# Patient Record
Sex: Male | Born: 1935 | Race: Black or African American | Hispanic: No | Smoking: Current every day smoker
Health system: Southern US, Community
[De-identification: ages and names within clinical notes are randomized; demographics above are authoritative.]

## PROBLEM LIST (undated history)

## (undated) DIAGNOSIS — G309 Alzheimer's disease, unspecified: Secondary | ICD-10-CM

## (undated) DIAGNOSIS — F0280 Dementia in other diseases classified elsewhere without behavioral disturbance: Secondary | ICD-10-CM

## (undated) DIAGNOSIS — I639 Cerebral infarction, unspecified: Secondary | ICD-10-CM

## (undated) DIAGNOSIS — R627 Adult failure to thrive: Secondary | ICD-10-CM

## (undated) DIAGNOSIS — C61 Malignant neoplasm of prostate: Secondary | ICD-10-CM

## (undated) DIAGNOSIS — R400 Somnolence: Secondary | ICD-10-CM

## (undated) DIAGNOSIS — C801 Malignant (primary) neoplasm, unspecified: Secondary | ICD-10-CM

---

## 2012-08-01 ENCOUNTER — Emergency Department (HOSPITAL_COMMUNITY): Payer: Medicare PPO

## 2012-08-01 ENCOUNTER — Emergency Department (HOSPITAL_COMMUNITY)
Admission: EM | Admit: 2012-08-01 | Discharge: 2012-08-01 | Disposition: A | Discharge status: Home / Self Care | Payer: Medicare PPO | Attending: Emergency Medicine | Admitting: Emergency Medicine

## 2012-08-01 ENCOUNTER — Encounter (HOSPITAL_COMMUNITY): Payer: Self-pay | Admitting: *Deleted

## 2012-08-01 DIAGNOSIS — I498 Other specified cardiac arrhythmias: Secondary | ICD-10-CM | POA: Insufficient documentation

## 2012-08-01 DIAGNOSIS — R001 Bradycardia, unspecified: Secondary | ICD-10-CM

## 2012-08-01 DIAGNOSIS — R55 Syncope and collapse: Secondary | ICD-10-CM | POA: Insufficient documentation

## 2012-08-01 DIAGNOSIS — G459 Transient cerebral ischemic attack, unspecified: Secondary | ICD-10-CM | POA: Insufficient documentation

## 2012-08-01 LAB — CBC WITH DIFFERENTIAL/PLATELET
Basophils Absolute: 0 10*3/uL (ref 0.0–0.1)
Basophils Relative: 0 % (ref 0–1)
Eosinophils Relative: 0 % (ref 0–5)
Lymphocytes Relative: 26 % (ref 12–46)
MCV: 84.5 fL (ref 78.0–100.0)
Neutro Abs: 4.2 10*3/uL (ref 1.7–7.7)
Platelets: 148 10*3/uL — ABNORMAL LOW (ref 150–400)
RDW: 15.3 % (ref 11.5–15.5)
WBC: 7.4 10*3/uL (ref 4.0–10.5)

## 2012-08-01 LAB — URINALYSIS, ROUTINE W REFLEX MICROSCOPIC
Glucose, UA: NEGATIVE mg/dL
Leukocytes, UA: NEGATIVE
Nitrite: NEGATIVE
Protein, ur: NEGATIVE mg/dL
Urobilinogen, UA: 4 mg/dL — ABNORMAL HIGH (ref 0.0–1.0)

## 2012-08-01 LAB — TROPONIN I: Troponin I: <0.3 ng/mL (ref <0.30)

## 2012-08-01 LAB — BASIC METABOLIC PANEL
CO2: 22 mEq/L (ref 19–32)
Calcium: 9.9 mg/dL (ref 8.4–10.5)
GFR calc Af Amer: 71 mL/min — ABNORMAL LOW (ref >90)
Sodium: 139 mEq/L (ref 135–145)

## 2012-08-01 NOTE — ED Provider Notes (Signed)
History  This chart was scribed for Donnetta Hutching, MD by Ladona Ridgel Day. This patient was seen in room APA11/APA11 and the patient's care was started at 1402.   CSN: [id number]  Arrival date & time 08/01/12  1402   First MD Initiated Contact with Patient 08/01/12 1419      Chief Complaint  Patient presents with  . Near Syncope  . Urinary Tract Infection   Patient is a 78 y.o. male presenting with syncope. The history is provided by the patient. No language interpreter was used.  Loss of Consciousness This is a new problem. The current episode started 1 to 2 hours ago. The problem has been resolved. Pertinent negatives include no chest pain, no abdominal pain and no shortness of breath. Nothing aggravates the symptoms. Nothing relieves the symptoms. He has tried nothing for the symptoms.   Jerry [name] is a 78 y.o. male brought in by ambulance, who presents to the Emergency Department complaining of a syncopal episode at home about 2 hours ago after he was standing on his porch, stood up walked inside and was found on the floor by family and was somewhat lethargic afterwards. He presents in ER and family states he seems back to normal now. He denies any injuries and feels fine now. Family states he may have had an episode of right sided lip drooping but lasted maybe 20 minutes and is back to normal now. He does not take any medicines. He denies any cp, sob, abdominal pain, or pain in his extremities. His daughter states he has had dark orange colored urine today and yesterday.    History reviewed. No pertinent past medical history.  History reviewed. No pertinent past surgical history.  No family history on file.  History  Substance Use Topics  . Smoking status: Current Every Day Smoker -- 0.5 packs/day    Types: Cigarettes  . Smokeless tobacco: Not on file  . Alcohol Use: Yes     occasional      Review of Systems  Constitutional: Negative for fever and chills.  HENT: Negative for  congestion.   Respiratory: Negative for shortness of breath.   Cardiovascular: Positive for syncope. Negative for chest pain.  Gastrointestinal: Negative for nausea, vomiting and abdominal pain.  Musculoskeletal: Negative for back pain.  Neurological: Positive for syncope. Negative for weakness. Facial asymmetry: possible lip drooping which is gone now.  All other systems reviewed and are negative.    Allergies  Review of patient's allergies indicates not on file.  Home Medications  No current outpatient prescriptions on file.  Triage Vitals: BP 134/53  Pulse 52  Temp 97.7 F (36.5 C) (Oral)  Resp 22  Ht 5\' 5"  (1.651 m)  Wt 125 lb (56.7 kg)  BMI 20.80 kg/m2  SpO2 99%  Physical Exam  Nursing note and vitals reviewed. Constitutional: He is oriented to person, place, and time. He appears well-developed and well-nourished.  HENT:  Head: Normocephalic and atraumatic.  Eyes: Conjunctivae normal and EOM are normal. Pupils are equal, round, and reactive to light.  Neck: Normal range of motion. Neck supple.  Cardiovascular: Normal rate, regular rhythm and normal heart sounds.   Pulmonary/Chest: Effort normal and breath sounds normal.  Abdominal: Soft. Bowel sounds are normal.  Musculoskeletal: Normal range of motion.  Neurological: He is alert and oriented to person, place, and time.  Skin: Skin is warm and dry.  Psychiatric: He has a normal mood and affect.    ED Course  Procedures (including critical  care time) DIAGNOSTIC STUDIES: Oxygen Saturation is 99% on room air, normal by my interpretation.    COORDINATION OF CARE: At 215 PM Discussed treatment plan with patient which includes head CT, heart markers, blood work, and UA. Patient agrees.   Labs Reviewed  CBC WITH DIFFERENTIAL - Abnormal; Notable for the following:    HCT 37.5 (*)     Platelets 148 (*)     Monocytes Relative 17 (*)     Monocytes Absolute 1.2 (*)     All other components within normal limits  BASIC  METABOLIC PANEL - Abnormal; Notable for the following:    GFR calc non Af Amer 61 (*)     GFR calc Af Amer 71 (*)     All other components within normal limits  TROPONIN I  URINALYSIS, ROUTINE W REFLEX MICROSCOPIC   No results found.   No diagnosis found.    Date: 08/01/2012  Rate: 49  Rhythm: sinus bradycardia  QRS Axis: left  Intervals: normal  ST/T Wave abnormalities: normal  Conduction Disutrbances:none  Narrative Interpretation:   Old EKG Reviewed: none available    MDM  Family reports normal behavior now. Patient may have had a slight TIA. Pulse is 48 which also could have contributed to the syncopal spell. Blood work, urinalysis, CT head showed no acute findings      I personally performed the services described in this documentation, which was scribed in my presence. The recorded information has been reviewed and considered.          Donnetta Hutching, MD 08/01/12 [phone]

## 2012-08-01 NOTE — ED Notes (Signed)
Pt is gone to CT  

## 2012-08-01 NOTE — ED Notes (Signed)
Pt arrived via EMS with c/o near syncope episode. Pt's family states pt was sitting in sun, pt was found lying in carport, was responsive," but not himself ". EMS reports pt was drooling upon arrival, [place] scale 13 at time of arrival. Pt quickly returned to baseline with no noted deficits. Pt is alert and oriented x 4.  Family also reports pt's urine is dark for past couple of days. Denies fever, N/V and pain

## 2012-08-01 NOTE — ED Notes (Signed)
Pt denies any pain, no acute distress noted at this time

## 2012-08-01 NOTE — ED Notes (Signed)
Pt is aware of a urine sample, pt states he will let staff know when he can void.  

## 2012-08-04 ENCOUNTER — Other Ambulatory Visit: Payer: Self-pay

## 2012-08-04 ENCOUNTER — Encounter (HOSPITAL_COMMUNITY): Payer: Self-pay | Admitting: Emergency Medicine

## 2012-08-04 ENCOUNTER — Emergency Department (HOSPITAL_COMMUNITY)
Admission: EM | Admit: 2012-08-04 | Discharge: 2012-08-04 | Disposition: A | Discharge status: Home / Self Care | Payer: Medicare PPO | Attending: Emergency Medicine | Admitting: Emergency Medicine

## 2012-08-04 DIAGNOSIS — R55 Syncope and collapse: Secondary | ICD-10-CM

## 2012-08-04 LAB — BASIC METABOLIC PANEL
Chloride: 107 mEq/L (ref 96–112)
GFR calc Af Amer: 58 mL/min — ABNORMAL LOW (ref >90)
GFR calc non Af Amer: 50 mL/min — ABNORMAL LOW (ref >90)
Potassium: 3.4 mEq/L — ABNORMAL LOW (ref 3.5–5.1)

## 2012-08-04 LAB — CBC WITH DIFFERENTIAL/PLATELET
Basophils Absolute: 0 10*3/uL (ref 0.0–0.1)
Basophils Relative: 0 % (ref 0–1)
Eosinophils Absolute: 0 10*3/uL (ref 0.0–0.7)
MCH: 29.5 pg (ref 26.0–34.0)
MCHC: 35.3 g/dL (ref 30.0–36.0)
Neutro Abs: 3 10*3/uL (ref 1.7–7.7)
Neutrophils Relative %: 60 % (ref 43–77)
Platelets: 128 10*3/uL — ABNORMAL LOW (ref 150–400)
RDW: 15.2 % (ref 11.5–15.5)

## 2012-08-04 MED ORDER — POTASSIUM CHLORIDE CRYS ER 20 MEQ PO TBCR
20.0000 meq | EXTENDED_RELEASE_TABLET | Freq: Once | ORAL | Status: AC
  Administered 2012-08-04: 20 meq via ORAL
  Filled 2012-08-04: qty 1

## 2012-08-04 NOTE — ED Notes (Signed)
Pt found slumped over in chair in yard after sitting in the sun x approximately 1 hour. [id number]. Pt alert but lethargic, sweaty upon ems arrival. Vss.

## 2012-08-04 NOTE — ED Provider Notes (Signed)
History     CSN: [id number]  Arrival date & time 08/04/12  1349   First MD Initiated Contact with Patient 08/04/12 1351      Chief Complaint  Patient presents with  . Loss of Consciousness    (Consider location/radiation/quality/duration/timing/severity/associated sxs/prior treatment) Patient is a 78 y.o. male presenting with syncope. The history is provided by the patient.  Loss of Consciousness Pertinent negatives include no chest pain, no abdominal pain, no headaches and no shortness of breath.  pt was sitting in chair in sun, family checked on and was slumped over in chair. On ems arrival pt  Awake and alert. Pt denies fainting, states is unsure if fell asleep. Denies any current or recent chest pain or discomfort. Denies palpitations. No sob. No headaches. No neck or back pain. States eating and drinking normally. Denies feeling lightheaded or dizzy. Denies any recent change in meds. No gi or gu c/o. No fever or chills.      History reviewed. No pertinent past medical history.  History reviewed. No pertinent past surgical history.  No family history on file.  History  Substance Use Topics  . Smoking status: Current Every Day Smoker -- 0.5 packs/day    Types: Cigarettes  . Smokeless tobacco: Not on file  . Alcohol Use: Yes     occasional      Review of Systems  Constitutional: Negative for fever.  HENT: Negative for sore throat and neck pain.   Eyes: Negative for pain and visual disturbance.  Respiratory: Negative for cough and shortness of breath.   Cardiovascular: Positive for syncope. Negative for chest pain.  Gastrointestinal: Negative for vomiting, abdominal pain, diarrhea, constipation and blood in stool.  Genitourinary: Negative for dysuria and flank pain.  Musculoskeletal: Negative for back pain.  Skin: Negative for rash.  Neurological: Negative for weakness, numbness and headaches.  Hematological: Does not bruise/bleed easily.  Psychiatric/Behavioral:  Negative for confusion.    Allergies  Review of patient's allergies indicates no known allergies.  Home Medications  No current outpatient prescriptions on file.  BP 133/72  Pulse 67  Temp 98.8 F (37.1 C) (Oral)  Resp 18  Wt 125 lb (56.7 kg)  SpO2 97%  Physical Exam  Nursing note and vitals reviewed. Constitutional: He is oriented to person, place, and time. He appears well-developed and well-nourished. No distress.  HENT:  Head: Atraumatic.  Nose: Nose normal.  Mouth/Throat: Oropharynx is clear and moist.  Eyes: Conjunctivae normal are normal. Pupils are equal, round, and reactive to light. No scleral icterus.  Neck: Neck supple. No tracheal deviation present.       No stiffness or rigidity  Cardiovascular: Normal rate, regular rhythm, normal heart sounds and intact distal pulses.   Pulmonary/Chest: Effort normal and breath sounds normal. No accessory muscle usage. No respiratory distress.  Abdominal: Soft. Bowel sounds are normal. He exhibits no distension and no mass. There is no tenderness. There is no rebound and no guarding.  Genitourinary:       No cva tenderness  Musculoskeletal: Normal range of motion. He exhibits no edema and no tenderness.  Neurological: He is alert and oriented to person, place, and time.       Motor intact bil.   Skin: Skin is warm and dry.  Psychiatric: He has a normal mood and affect.    ED Course  Procedures (including critical care time)   Labs Reviewed  CBC WITH DIFFERENTIAL  BASIC METABOLIC PANEL      MDM  Iv ns. Monitor. Labs.  Pt denies any c/o. States feels fine. Denies loc or feeing faint.   Reviewed nursing notes and prior charts for additional history.     Date: 08/04/2012  Rate: 70  Rhythm: normal sinus rhythm  QRS Axis: left  Intervals: normal  ST/T Wave abnormalities: normal  Conduction Disutrbances:none  Narrative Interpretation:   Old EKG Reviewed: unchanged   Reviewed recent ed visit, trop/labs then  unremarkable. Had ct head done 10/1 showing no acute process.  Pt remains in nsr on monitor.   Pt denies any c/o on recheck.  Pt continues to deny any c/o, states feels at baseline. Pt currently appears stable for d/c and is without any physical c/o.          Suzi Roots, MD 08/04/12 [phone]

## 2012-08-04 NOTE — ED Notes (Signed)
Pt ambulated around nursing desk with no problems. Denies any dizziness. No weakness noted.

## 2012-08-05 ENCOUNTER — Emergency Department (HOSPITAL_COMMUNITY)
Admission: EM | Admit: 2012-08-05 | Discharge: 2012-08-05 | Disposition: A | Discharge status: Home / Self Care | Payer: Medicare PPO

## 2012-08-05 NOTE — ED Notes (Signed)
Pt returned to er  Today for iv removal.  Family present spoke to [employer]. Cath removed by rn.

## 2012-10-17 ENCOUNTER — Emergency Department (HOSPITAL_COMMUNITY): Payer: Medicare PPO

## 2012-10-17 ENCOUNTER — Other Ambulatory Visit: Payer: Self-pay

## 2012-10-17 ENCOUNTER — Encounter (HOSPITAL_COMMUNITY): Payer: Self-pay

## 2012-10-17 ENCOUNTER — Inpatient Hospital Stay (HOSPITAL_COMMUNITY)
Admission: EM | Admit: 2012-10-17 | Discharge: 2012-10-21 | DRG: 481 | Disposition: A | Discharge status: Skilled Nursing Facility | Payer: Medicare PPO | Attending: Internal Medicine | Admitting: Internal Medicine

## 2012-10-17 DIAGNOSIS — D649 Anemia, unspecified: Secondary | ICD-10-CM | POA: Diagnosis present

## 2012-10-17 DIAGNOSIS — S72002A Fracture of unspecified part of neck of left femur, initial encounter for closed fracture: Secondary | ICD-10-CM

## 2012-10-17 DIAGNOSIS — S72033A Displaced midcervical fracture of unspecified femur, initial encounter for closed fracture: Principal | ICD-10-CM | POA: Diagnosis present

## 2012-10-17 DIAGNOSIS — I10 Essential (primary) hypertension: Secondary | ICD-10-CM

## 2012-10-17 DIAGNOSIS — N390 Urinary tract infection, site not specified: Secondary | ICD-10-CM | POA: Diagnosis present

## 2012-10-17 DIAGNOSIS — E876 Hypokalemia: Secondary | ICD-10-CM | POA: Diagnosis present

## 2012-10-17 DIAGNOSIS — D72829 Elevated white blood cell count, unspecified: Secondary | ICD-10-CM | POA: Diagnosis present

## 2012-10-17 DIAGNOSIS — W010XXA Fall on same level from slipping, tripping and stumbling without subsequent striking against object, initial encounter: Secondary | ICD-10-CM | POA: Diagnosis present

## 2012-10-17 DIAGNOSIS — A498 Other bacterial infections of unspecified site: Secondary | ICD-10-CM | POA: Diagnosis present

## 2012-10-17 DIAGNOSIS — S72009A Fracture of unspecified part of neck of unspecified femur, initial encounter for closed fracture: Secondary | ICD-10-CM

## 2012-10-17 DIAGNOSIS — E870 Hyperosmolality and hypernatremia: Secondary | ICD-10-CM | POA: Diagnosis not present

## 2012-10-17 DIAGNOSIS — Z23 Encounter for immunization: Secondary | ICD-10-CM

## 2012-10-17 DIAGNOSIS — Y92009 Unspecified place in unspecified non-institutional (private) residence as the place of occurrence of the external cause: Secondary | ICD-10-CM

## 2012-10-17 DIAGNOSIS — F17200 Nicotine dependence, unspecified, uncomplicated: Secondary | ICD-10-CM | POA: Diagnosis present

## 2012-10-17 LAB — CBC WITH DIFFERENTIAL/PLATELET
Band Neutrophils: 0 % (ref 0–10)
Basophils Absolute: 0 K/uL (ref 0.0–0.1)
Basophils Relative: 0 % (ref 0–1)
Blasts: 0 %
Eosinophils Absolute: 0 K/uL (ref 0.0–0.7)
Eosinophils Relative: 0 % (ref 0–5)
HCT: 39 % (ref 39.0–52.0)
Hemoglobin: 13.5 g/dL (ref 13.0–17.0)
Lymphocytes Relative: 15 % (ref 12–46)
Lymphs Abs: 2.3 K/uL (ref 0.7–4.0)
MCH: 28.8 pg (ref 26.0–34.0)
MCHC: 34.6 g/dL (ref 30.0–36.0)
MCV: 83.2 fL (ref 78.0–100.0)
Metamyelocytes Relative: 0 %
Monocytes Absolute: 1.7 K/uL — ABNORMAL HIGH (ref 0.1–1.0)
Monocytes Relative: 11 % (ref 3–12)
Myelocytes: 0 %
Neutro Abs: 11.6 K/uL — ABNORMAL HIGH (ref 1.7–7.7)
Neutrophils Relative %: 74 % (ref 43–77)
Platelets: 178 K/uL (ref 150–400)
Promyelocytes Absolute: 0 %
RBC: 4.69 MIL/uL (ref 4.22–5.81)
RDW: 14.9 % (ref 11.5–15.5)
WBC: 15.6 K/uL — ABNORMAL HIGH (ref 4.0–10.5)
nRBC: 0 /100{WBCs}

## 2012-10-17 LAB — BASIC METABOLIC PANEL WITH GFR
BUN: 13 mg/dL (ref 6–23)
CO2: 28 meq/L (ref 19–32)
Calcium: 10 mg/dL (ref 8.4–10.5)
Chloride: 109 meq/L (ref 96–112)
Creatinine, Ser: 1.1 mg/dL (ref 0.50–1.35)
GFR calc Af Amer: 64 mL/min — ABNORMAL LOW
GFR calc non Af Amer: 55 mL/min — ABNORMAL LOW
Glucose, Bld: 92 mg/dL (ref 70–99)
Potassium: 3.4 meq/L — ABNORMAL LOW (ref 3.5–5.1)
Sodium: 145 meq/L (ref 135–145)

## 2012-10-17 LAB — URINALYSIS, ROUTINE W REFLEX MICROSCOPIC
Glucose, UA: NEGATIVE mg/dL
Ketones, ur: NEGATIVE mg/dL
Nitrite: POSITIVE — AB
Protein, ur: 100 mg/dL — AB
Urobilinogen, UA: 2 mg/dL — ABNORMAL HIGH (ref 0.0–1.0)

## 2012-10-17 LAB — PROTIME-INR: INR: 1.09 (ref 0.00–1.49)

## 2012-10-17 LAB — ABO/RH: ABO/RH(D): O POS

## 2012-10-17 LAB — URINE MICROSCOPIC-ADD ON

## 2012-10-17 LAB — PREPARE RBC (CROSSMATCH)

## 2012-10-17 MED ORDER — DEXTROSE 5 % IV SOLN
INTRAVENOUS | Status: AC
  Filled 2012-10-17: qty 10

## 2012-10-17 MED ORDER — HYDROCODONE-ACETAMINOPHEN 5-325 MG PO TABS
1.0000 | ORAL_TABLET | ORAL | Status: DC | PRN
  Administered 2012-10-17: 1 via ORAL
  Administered 2012-10-18 – 2012-10-21 (×6): 2 via ORAL
  Filled 2012-10-17: qty 2
  Filled 2012-10-17: qty 1
  Filled 2012-10-17 (×3): qty 2
  Filled 2012-10-17: qty 1
  Filled 2012-10-17 (×3): qty 2

## 2012-10-17 MED ORDER — ONDANSETRON HCL 4 MG PO TABS: 4.0000 mg | ORAL_TABLET | Freq: Four times a day (QID) | ORAL | Status: DC | PRN

## 2012-10-17 MED ORDER — MORPHINE SULFATE 2 MG/ML IJ SOLN
0.5000 mg | INTRAMUSCULAR | Status: DC | PRN
  Administered 2012-10-17 – 2012-10-21 (×3): 0.5 mg via INTRAVENOUS
  Filled 2012-10-17 (×3): qty 1

## 2012-10-17 MED ORDER — HALOPERIDOL LACTATE 5 MG/ML IJ SOLN
2.0000 mg | Freq: Four times a day (QID) | INTRAMUSCULAR | Status: DC | PRN
  Administered 2012-10-20: 2 mg via INTRAVENOUS
  Filled 2012-10-17 (×2): qty 1

## 2012-10-17 MED ORDER — PNEUMOCOCCAL VAC POLYVALENT 25 MCG/0.5ML IJ INJ
0.5000 mL | INJECTION | INTRAMUSCULAR | Status: AC
  Administered 2012-10-19: 0.5 mL via INTRAMUSCULAR
  Filled 2012-10-17: qty 0.5

## 2012-10-17 MED ORDER — CEFAZOLIN SODIUM-DEXTROSE 2-3 GM-% IV SOLR
2.0000 g | Freq: Once | INTRAVENOUS | Status: AC
  Administered 2012-10-18: 2 g via INTRAVENOUS

## 2012-10-17 MED ORDER — HYDROCODONE-ACETAMINOPHEN 5-325 MG PO TABS: 1.0000 | ORAL_TABLET | ORAL | Status: DC | PRN

## 2012-10-17 MED ORDER — ONDANSETRON HCL 4 MG/2ML IJ SOLN: 4.0000 mg | Freq: Four times a day (QID) | INTRAMUSCULAR | Status: DC | PRN

## 2012-10-17 MED ORDER — INFLUENZA VIRUS VACC SPLIT PF IM SUSP
0.5000 mL | INTRAMUSCULAR | Status: AC
  Administered 2012-10-19: 0.5 mL via INTRAMUSCULAR
  Filled 2012-10-17: qty 0.5

## 2012-10-17 MED ORDER — ONDANSETRON HCL 4 MG/2ML IJ SOLN
4.0000 mg | Freq: Three times a day (TID) | INTRAMUSCULAR | Status: DC | PRN
  Administered 2012-10-17: 4 mg via INTRAVENOUS
  Filled 2012-10-17: qty 2

## 2012-10-17 MED ORDER — ACETAMINOPHEN 650 MG RE SUPP: 650.0000 mg | Freq: Four times a day (QID) | RECTAL | Status: DC | PRN

## 2012-10-17 MED ORDER — POTASSIUM CHLORIDE IN NACL 20-0.9 MEQ/L-% IV SOLN
INTRAVENOUS | Status: DC
  Administered 2012-10-17 – 2012-10-19 (×3): via INTRAVENOUS

## 2012-10-17 MED ORDER — POVIDONE-IODINE 10 % EX SOLN
Freq: Once | CUTANEOUS | Status: DC
  Filled 2012-10-17: qty 118

## 2012-10-17 MED ORDER — MORPHINE SULFATE 2 MG/ML IJ SOLN
0.5000 mg | INTRAMUSCULAR | Status: DC | PRN
  Administered 2012-10-17: 0.5 mg via INTRAVENOUS
  Filled 2012-10-17: qty 1

## 2012-10-17 MED ORDER — POTASSIUM CHLORIDE IN NACL 20-0.45 MEQ/L-% IV SOLN
INTRAVENOUS | Status: DC
  Filled 2012-10-17 (×3): qty 1000

## 2012-10-17 MED ORDER — DEXTROSE 5 % IV SOLN
1.0000 g | INTRAVENOUS | Status: DC
  Administered 2012-10-17 – 2012-10-20 (×4): 1 g via INTRAVENOUS
  Filled 2012-10-17 (×6): qty 10

## 2012-10-17 MED ORDER — ACETAMINOPHEN 325 MG PO TABS: 650.0000 mg | ORAL_TABLET | Freq: Four times a day (QID) | ORAL | Status: DC | PRN

## 2012-10-17 MED ORDER — SODIUM CHLORIDE 0.9 % IV SOLN
1000.0000 mL | INTRAVENOUS | Status: DC
  Administered 2012-10-17: 1000 mL via INTRAVENOUS

## 2012-10-17 NOTE — Consult Note (Signed)
Reason for Consult:Fracture of left hip Referring Physician: ER  Jerry [name] is an 78 y.o. male.  HPI: He fell at home today and hurt his left hip.  He has no other injury.  He is amazingly healthy.  He lives with his son and daughter-in-law.  X-rays show a fracture nondisplaced of the left femoral neck.  He has no local doctor.  History reviewed. No pertinent past medical history.  History reviewed. No pertinent past surgical history.  No family history on file.  Social History:  reports that he has been smoking Cigarettes.  He has a 38 pack-year smoking history. He does not have any smokeless tobacco history on file. He reports that he drinks alcohol. He reports that he does not use illicit drugs.  Allergies: No Known Allergies  Medications: I have reviewed the patient's current medications.  Results for orders placed during the hospital encounter of 10/17/12 (from the past 48 hour(s))  BASIC METABOLIC PANEL     Status: Abnormal   Collection Time   10/17/12  1:26 PM      Component Value Range Comment   Sodium 145  135 - 145 mEq/L    Potassium 3.4 (*) 3.5 - 5.1 mEq/L    Chloride 109  96 - 112 mEq/L    CO2 28  19 - 32 mEq/L    Glucose, Bld 92  70 - 99 mg/dL    BUN 13  6 - 23 mg/dL    Creatinine, Ser 1.61  0.50 - 1.35 mg/dL    Calcium 09.6  8.4 - 10.5 mg/dL    GFR calc non Af Amer 55 (*) >90 mL/min    GFR calc Af Amer 64 (*) >90 mL/min   CBC WITH DIFFERENTIAL     Status: Abnormal   Collection Time   10/17/12  1:26 PM      Component Value Range Comment   WBC 15.6 (*) 4.0 - 10.5 K/uL    RBC 4.69  4.22 - 5.81 MIL/uL    Hemoglobin 13.5  13.0 - 17.0 g/dL    HCT 04.5  40.9 - 81.1 %    MCV 83.2  78.0 - 100.0 fL    MCH 28.8  26.0 - 34.0 pg    MCHC 34.6  30.0 - 36.0 g/dL    RDW 91.4  78.2 - 95.6 %    Platelets 178  150 - 400 K/uL    Neutrophils Relative 74  43 - 77 %    Lymphocytes Relative 15  12 - 46 %    Monocytes Relative 11  3 - 12 %    Eosinophils Relative 0  0 - 5 %     Basophils Relative 0  0 - 1 %    Band Neutrophils 0  0 - 10 %    Metamyelocytes Relative 0      Myelocytes 0      Promyelocytes Absolute 0      Blasts 0      nRBC 0  0 /100 WBC    WBC Morphology ATYPICAL LYMPHOCYTES      Neutro Abs 11.6 (*) 1.7 - 7.7 K/uL    Lymphs Abs 2.3  0.7 - 4.0 K/uL    Monocytes Absolute 1.7 (*) 0.1 - 1.0 K/uL    Eosinophils Absolute 0.0  0.0 - 0.7 K/uL    Basophils Absolute 0.0  0.0 - 0.1 K/uL   PROTIME-INR     Status: Normal   Collection Time   10/17/12  1:26 PM      Component Value Range Comment   Prothrombin Time 14.0  11.6 - 15.2 seconds    INR 1.09  0.00 - 1.49   TYPE AND SCREEN     Status: Normal   Collection Time   10/17/12  1:45 PM      Component Value Range Comment   ABO/RH(D) O POS      Antibody Screen NEG      Sample Expiration 10/20/2012     URINALYSIS, ROUTINE W REFLEX MICROSCOPIC     Status: Abnormal   Collection Time   10/17/12  2:26 PM      Component Value Range Comment   Color, Urine YELLOW  YELLOW    APPearance CLOUDY (*) CLEAR    Specific Gravity, Urine 1.015  1.005 - 1.030    pH 8.0  5.0 - 8.0    Glucose, UA NEGATIVE  NEGATIVE mg/dL    Hgb urine dipstick LARGE (*) NEGATIVE    Bilirubin Urine NEGATIVE  NEGATIVE    Ketones, ur NEGATIVE  NEGATIVE mg/dL    Protein, ur 161 (*) NEGATIVE mg/dL    Urobilinogen, UA 2.0 (*) 0.0 - 1.0 mg/dL    Nitrite POSITIVE (*) NEGATIVE    Leukocytes, UA MODERATE (*) NEGATIVE   URINE MICROSCOPIC-ADD ON     Status: Abnormal   Collection Time   10/17/12  2:26 PM      Component Value Range Comment   WBC, UA TOO NUMEROUS TO COUNT  <3 WBC/hpf    RBC / HPF TOO NUMEROUS TO COUNT  <3 RBC/hpf    Bacteria, UA MANY (*) RARE     Dg Chest 1 View  10/17/2012  *RADIOLOGY REPORT*  Clinical Data: Left hip fracture post fall  CHEST - 1 VIEW  Comparison: None  Findings: Skin folds project over the chest bilaterally. Normal heart size, mediastinal contours, and pulmonary vascularity. Atherosclerotic  calcification aorta. Lungs clear. Question underlying emphysematous changes. No pleural effusion or pneumothorax. Bones appear demineralized.  IMPRESSION: Question emphysematous changes. No acute abnormalities.   Original Report Authenticated By: Ulyses Southward, M.D.    Dg Hip Complete Left  10/17/2012  *RADIOLOGY REPORT*  Clinical Data: Left hip pain post fall  LEFT HIP - COMPLETE 2+ VIEW  Comparison: None  Findings: Osseous demineralization. Hip joint spaces preserved. Subcapital left femoral neck fracture, not significantly displaced. No dislocation identified. Pelvis appears intact. Degenerative disc and facet disease changes lumbar spine. Scattered vascular calcifications.  IMPRESSION: Subcapital fracture left femoral neck.   Original Report Authenticated By: Ulyses Southward, M.D.     Review of Systems  Musculoskeletal: Positive for falls (He fell today and hurt his left hip.  He has no other injury.).  All other systems reviewed and are negative.   Blood pressure 133/72, pulse 80, temperature 97.2 F (36.2 C), temperature source Oral, resp. rate 18. Physical Exam  Constitutional: He is oriented to person, place, and time. He appears well-developed and well-nourished.  HENT:  Head: Normocephalic and atraumatic.  Eyes: Conjunctivae normal and EOM are normal. Pupils are equal, round, and reactive to light.  Neck: Normal range of motion.  Cardiovascular: Normal rate, regular rhythm and intact distal pulses.   Respiratory: Effort normal and breath sounds normal.  GI: Soft. Bowel sounds are normal.  Musculoskeletal: He exhibits tenderness (Pain left hip with movement.).  Neurological: He is alert and oriented to person, place, and time. He has normal reflexes.  Skin: Skin is warm and dry.  Psychiatric:  He has a normal mood and affect. His behavior is normal. Judgment and thought content normal.    Assessment/Plan: Left hip femoral neck fracture, nondisplaced.  He will need Asnis hip pinning on  the left.  I have discussed the procedure with him and his son and daughter-in-law.  I have explained the risks and imponderables of the procedure including infection, embolus which could cause death, possible blood transfusion, need for physical therapy, need for skilled nursing home placement and have recommended spinal anesthesia.  They appear to understand and agree to the procedure.  I have tentatively scheduled surgery for tomorrow afternoon.  Tykisha Areola 10/17/2012, 4:07 PM

## 2012-10-17 NOTE — H&P (Signed)
[facility] History and Physical  Jerry [name] ZOX:[id number] DOB: Aug 18, 1916 DOA: 10/17/2012  Referring physician: Dr. Ignacia Palma PCP: No primary provider on file.  Specialists:   Chief Complaint: fall  HPI: Jerry [name] is a 78 y.o. male with no known medical problems and is not on any chronic medications, who is brought to the emergency room after suffering a fall. His family reports that he has been falling at home for the past few weeks. He denies loss of consciousness or head trauma. He reports stumbling over his feet and falling. He denies any dizziness. He's not had any fever or cough. No history of confusion. His family does report increased urinary frequency and urgency. No nausea or vomiting. By mouth intake has been somewhat declined over the past few days. He is otherwise pretty functional, ambulates independently other than the past few weeks. He was evaluated in the emergency room was found to have a left-sided hip fracture. He'll be admitted for further treatment  Review of Systems: pertinent positives as per HPI, otherwise negative.  History reviewed. No pertinent past medical history. History reviewed. No pertinent past surgical history. Social History:  reports that he has been smoking Cigarettes.  He has a 38 pack-year smoking history. He does not have any smokeless tobacco history on file. He reports that he drinks alcohol. He reports that he does not use illicit drugs.   No Known Allergies  No family history on file. discussed with patient and his family. They're unaware of any medical problems in the family. The patient's mother lived into her 47s. They're unaware as to the cause of her death.  Prior to Admission medications   Not on File   Physical Exam: Filed Vitals:   10/17/12 1238 10/17/12 1617 10/17/12 1652  BP: 133/72 174/97 123/83  Pulse: 80 86 92  Temp: 97.2 F (36.2 C) 97.4 F (36.3 C) 97.5 F (36.4 C)  TempSrc: Oral Oral Axillary  Resp:  18 20 20   Height:  5\' 7"  (1.702 m) 5\' 5"  (1.651 m)  Weight:  47.6 kg (104 lb 15 oz)   SpO2:  91% 93%     General:  No acute distress  Eyes: Pupils are equal round react to light  ENT: Mucous membranes are moist  Neck: Supple  Cardiovascular: S1, S2, regular rate and rhythm  Respiratory: Clear to auscultation bilaterally  Abdomen: Soft, nontender, nondistended, bowel sounds are active  Skin: Deferred  Musculoskeletal: Tenderness over left hip  Psychiatric: Normal affect, cooperative with exam  Neurologic: Grossly intact, nonfocal, hard of hearing  Labs on Admission:  Basic Metabolic Panel:  Lab 10/17/12 8119  NA 145  K 3.4*  CL 109  CO2 28  GLUCOSE 92  BUN 13  CREATININE 1.10  CALCIUM 10.0  MG --  PHOS --   Liver Function Tests: No results found for this basename: AST:5,ALT:5,ALKPHOS:5,BILITOT:5,PROT:5,ALBUMIN:5 in the last 168 hours No results found for this basename: LIPASE:5,AMYLASE:5 in the last 168 hours No results found for this basename: AMMONIA:5 in the last 168 hours CBC:  Lab 10/17/12 1326  WBC 15.6*  NEUTROABS 11.6*  HGB 13.5  HCT 39.0  MCV 83.2  PLT 178   Cardiac Enzymes: No results found for this basename: CKTOTAL:5,CKMB:5,CKMBINDEX:5,TROPONINI:5 in the last 168 hours  BNP (last 3 results) No results found for this basename: PROBNP:3 in the last 8760 hours CBG: No results found for this basename: GLUCAP:5 in the last 168 hours  Radiological Exams on Admission: Dg Chest 1 View  10/17/2012  *RADIOLOGY REPORT*  Clinical Data: Left hip fracture post fall  CHEST - 1 VIEW  Comparison: None  Findings: Skin folds project over the chest bilaterally. Normal heart size, mediastinal contours, and pulmonary vascularity. Atherosclerotic calcification aorta. Lungs clear. Question underlying emphysematous changes. No pleural effusion or pneumothorax. Bones appear demineralized.  IMPRESSION: Question emphysematous changes. No acute abnormalities.    Original Report Authenticated By: Ulyses Southward, M.D.    Dg Hip Complete Left  10/17/2012  *RADIOLOGY REPORT*  Clinical Data: Left hip pain post fall  LEFT HIP - COMPLETE 2+ VIEW  Comparison: None  Findings: Osseous demineralization. Hip joint spaces preserved. Subcapital left femoral neck fracture, not significantly displaced. No dislocation identified. Pelvis appears intact. Degenerative disc and facet disease changes lumbar spine. Scattered vascular calcifications.  IMPRESSION: Subcapital fracture left femoral neck.   Original Report Authenticated By: Ulyses Southward, M.D.     EKG: Independently reviewed. Non acute  Assessment/Plan Principal Problem:  *Hip fracture, left Active Problems:  UTI (lower urinary tract infection)  Hypokalemia  Leukocytosis   1. Hip fracture. Patient has been seen by Dr. Hilda Lias and tentative plans are for surgical repair tomorrow. 2. UTI. Patient's urinalysis is indicative of infection. We'll followup urine culture. Start empiric Rocephin for now. 3. Hypokalemia. Replace 4. Leukocytosis. Possible UTI versus stressed demargination. Continue to follow 5. DVT prophylaxis. Patient will be provided SCDs for now. We will start Lovenox after surgery tomorrow.  Code Status: Full code Family Communication: Discuss with the patient and his children at the bedside Disposition Plan: Will likely need nursing home placement after surgery.  Time spent:  Zoeie Ritter [facility] Pager [phone]  If 7PM-7AM, please contact night-coverage [email or web address] Password [facility] 10/17/2012, 5:58 PM

## 2012-10-17 NOTE — ED Provider Notes (Addendum)
History   This chart was scribed for Carleene Cooper III, MD by Sofie Rower, ED Scribe. The patient was seen in room APA10/APA10 and the patient's care was started at 1:15PM.    CSN: [id number]  Arrival date & time 10/17/12  1224   None     Chief Complaint  Patient presents with  . Fall    (Consider location/radiation/quality/duration/timing/severity/associated sxs/prior treatment) The history is provided by a relative and the patient. No language interpreter was used.    Jerry [name] is a 78 y.o. male , who presents to the Emergency Department complaining of sudden, progressively worsening, hip pain located at the left hip, onset today (10/17/12). The pt reports he fell earlier this morning, impacting upon and injuring his left hip. The pt informs he was not able to ambulate after the incident.  The pt is a current everyday smoker (0.5 packs/day), in addition to drinking alcohol occasionally.      History reviewed. No pertinent past medical history.  History reviewed. No pertinent past surgical history.  No family history on file.  History  Substance Use Topics  . Smoking status: Current Every Day Smoker -- 0.5 packs/day    Types: Cigarettes  . Smokeless tobacco: Not on file  . Alcohol Use: Yes     Comment: occasional      Review of Systems  Musculoskeletal: Positive for arthralgias.  All other systems reviewed and are negative.    Allergies  Review of patient's allergies indicates no known allergies.  Home Medications  No current outpatient prescriptions on file.  BP 133/72  Pulse 80  Temp 97.2 F (36.2 C) (Oral)  Resp 18  Physical Exam  Nursing note and vitals reviewed. Constitutional: He is oriented to person, place, and time. He appears well-developed and well-nourished. No distress.  HENT:  Head: Normocephalic and atraumatic.  Nose: Nose normal.       Poor dentition.   Eyes: EOM are normal. Pupils are equal, round, and reactive to light.   Neck: Normal range of motion. Neck supple. No tracheal deviation present.  Cardiovascular: Normal rate, regular rhythm and normal heart sounds.   Pulmonary/Chest: Effort normal and breath sounds normal. No respiratory distress.  Abdominal: Soft. Bowel sounds are normal. He exhibits no distension. There is no tenderness.  Musculoskeletal: Normal range of motion. He exhibits no edema.       Left hip: He exhibits tenderness.       Tenderness over left greater trochanter.   Neurological: He is alert and oriented to person, place, and time. No sensory deficit.  Skin: Skin is warm and dry.  Psychiatric: He has a normal mood and affect. His behavior is normal.    ED Course  Procedures (including critical care time)  DIAGNOSTIC STUDIES: COORDINATION OF CARE:  1:18 PM- Treatment plan concerning x-ray results and broken left hip discussed with patient. Pt agrees with treatment.      Labs Reviewed - No data to display  Dg Chest 1 View  10/17/2012  *RADIOLOGY REPORT*  Clinical Data: Left hip fracture post fall  CHEST - 1 VIEW  Comparison: None  Findings: Skin folds project over the chest bilaterally. Normal heart size, mediastinal contours, and pulmonary vascularity. Atherosclerotic calcification aorta. Lungs clear. Question underlying emphysematous changes. No pleural effusion or pneumothorax. Bones appear demineralized.  IMPRESSION: Question emphysematous changes. No acute abnormalities.   Original Report Authenticated By: Ulyses Southward, M.D.    Dg Hip Complete Left  10/17/2012  *RADIOLOGY REPORT*  Clinical Data:  Left hip pain post fall  LEFT HIP - COMPLETE 2+ VIEW  Comparison: None  Findings: Osseous demineralization. Hip joint spaces preserved. Subcapital left femoral neck fracture, not significantly displaced. No dislocation identified. Pelvis appears intact. Degenerative disc and facet disease changes lumbar spine. Scattered vascular calcifications.  IMPRESSION: Subcapital fracture left  femoral neck.   Original Report Authenticated By: Ulyses Southward, M.D.    1:40 PM Case discussed with Dr. Hilda Lias, who will consult.  2:10 PM Case discussed with Dr. Kerry Hough.  Admit to a med-surg bed.  2:34 PM  Date: 10/17/2012  Rate: 84  Rhythm: normal sinus rhythm  QRS Axis: left  Intervals: normal QRS:  Poor R wave progression in precordial leads suggests old anterior myocardial infarction.  ST/T Wave abnormalities: normal  Conduction Disutrbances:none  Narrative Interpretation: Abnormal EKG  Old EKG Reviewed: unchanged    1. Fracture of left hip     I personally performed the services described in this documentation, which was scribed in my presence. The recorded information has been reviewed and is accurate. Osvaldo Human, MD    Carleene Cooper III, MD 10/17/12 1414     Carleene Cooper III, MD 10/17/12 1435    Carleene Cooper III, MD 10/17/12 [phone]

## 2012-10-17 NOTE — ED Notes (Signed)
Per family, pt has been falling around. Larey Seat again this morning. Complain of pain in left hip

## 2012-10-17 NOTE — ED Notes (Signed)
Attempted IV x2 without success  

## 2012-10-18 ENCOUNTER — Inpatient Hospital Stay (HOSPITAL_COMMUNITY): Payer: Medicare PPO

## 2012-10-18 ENCOUNTER — Inpatient Hospital Stay (HOSPITAL_COMMUNITY): Payer: Medicare PPO | Admitting: Anesthesiology

## 2012-10-18 ENCOUNTER — Encounter (HOSPITAL_COMMUNITY): Payer: Self-pay | Admitting: Anesthesiology

## 2012-10-18 ENCOUNTER — Encounter (HOSPITAL_COMMUNITY): Payer: Self-pay | Admitting: *Deleted

## 2012-10-18 ENCOUNTER — Encounter (HOSPITAL_COMMUNITY)
Admission: EM | Disposition: A | Discharge status: Skilled Nursing Facility | Payer: Self-pay | Source: Home / Self Care | Attending: Internal Medicine

## 2012-10-18 HISTORY — PX: HIP PINNING,CANNULATED: SHX1758

## 2012-10-18 LAB — CBC
HCT: 33.8 % — ABNORMAL LOW (ref 39.0–52.0)
MCHC: 34.6 g/dL (ref 30.0–36.0)
Platelets: 170 10*3/uL (ref 150–400)
RDW: 14.8 % (ref 11.5–15.5)
WBC: 11.9 10*3/uL — ABNORMAL HIGH (ref 4.0–10.5)

## 2012-10-18 LAB — SURGICAL PCR SCREEN
MRSA, PCR: NEGATIVE
Staphylococcus aureus: NEGATIVE

## 2012-10-18 LAB — BASIC METABOLIC PANEL
BUN: 12 mg/dL (ref 6–23)
Calcium: 9 mg/dL (ref 8.4–10.5)
Creatinine, Ser: 1.08 mg/dL (ref 0.50–1.35)
GFR calc Af Amer: 66 mL/min — ABNORMAL LOW (ref >90)
GFR calc non Af Amer: 57 mL/min — ABNORMAL LOW (ref >90)

## 2012-10-18 LAB — TSH: TSH: 2.283 u[IU]/mL (ref 0.350–4.500)

## 2012-10-18 SURGERY — FIXATION, FEMUR, NECK, PERCUTANEOUS, USING SCREW
Anesthesia: Spinal | Site: Hip | Laterality: Left | Wound class: Clean

## 2012-10-18 MED ORDER — LIDOCAINE HCL (PF) 1 % IJ SOLN
INTRAMUSCULAR | Status: AC
  Filled 2012-10-18: qty 5

## 2012-10-18 MED ORDER — MAGNESIUM HYDROXIDE 400 MG/5ML PO SUSP: 30.0000 mL | Freq: Every day | ORAL | Status: DC | PRN

## 2012-10-18 MED ORDER — FENTANYL CITRATE 0.05 MG/ML IJ SOLN
INTRAMUSCULAR | Status: AC
  Filled 2012-10-18: qty 2

## 2012-10-18 MED ORDER — FENTANYL CITRATE 0.05 MG/ML IJ SOLN: 25.0000 ug | INTRAMUSCULAR | Status: DC | PRN

## 2012-10-18 MED ORDER — BUPIVACAINE HCL (PF) 0.75 % IJ SOLN
INTRAMUSCULAR | Status: DC | PRN
  Administered 2012-10-18: 11.25 mg via INTRATHECAL

## 2012-10-18 MED ORDER — CEFAZOLIN SODIUM 1-5 GM-% IV SOLN
INTRAVENOUS | Status: AC
  Filled 2012-10-18: qty 50

## 2012-10-18 MED ORDER — CEFAZOLIN SODIUM-DEXTROSE 2-3 GM-% IV SOLR
INTRAVENOUS | Status: AC
  Filled 2012-10-18: qty 50

## 2012-10-18 MED ORDER — FENTANYL CITRATE 0.05 MG/ML IJ SOLN
INTRAMUSCULAR | Status: DC | PRN
  Administered 2012-10-18 (×5): 12.5 ug via INTRAVENOUS

## 2012-10-18 MED ORDER — ENOXAPARIN SODIUM 40 MG/0.4ML ~~LOC~~ SOLN
40.0000 mg | SUBCUTANEOUS | Status: DC
  Administered 2012-10-19: 40 mg via SUBCUTANEOUS
  Filled 2012-10-18: qty 0.4

## 2012-10-18 MED ORDER — PROMETHAZINE HCL 25 MG/ML IJ SOLN
12.5000 mg | INTRAMUSCULAR | Status: DC | PRN
Start: 2012-10-18 — End: 2012-10-21

## 2012-10-18 MED ORDER — POVIDONE-IODINE 10 % EX SOLN
Freq: Once | CUTANEOUS | Status: AC
  Administered 2012-10-18: 11:00:00 via TOPICAL
  Filled 2012-10-18: qty 118

## 2012-10-18 MED ORDER — PROPOFOL INFUSION 10 MG/ML OPTIME
INTRAVENOUS | Status: DC | PRN
  Administered 2012-10-18: 50 ug/kg/min via INTRAVENOUS

## 2012-10-18 MED ORDER — ONDANSETRON HCL 4 MG/2ML IJ SOLN: 4.0000 mg | Freq: Once | INTRAMUSCULAR | Status: DC | PRN

## 2012-10-18 MED ORDER — FENTANYL CITRATE 0.05 MG/ML IJ SOLN
INTRAMUSCULAR | Status: DC | PRN
  Administered 2012-10-18: 12.5 ug via INTRATHECAL

## 2012-10-18 MED ORDER — ALBUTEROL SULFATE (5 MG/ML) 0.5% IN NEBU
2.5000 mg | INHALATION_SOLUTION | Freq: Four times a day (QID) | RESPIRATORY_TRACT | Status: DC
  Administered 2012-10-18 – 2012-10-19 (×2): 2.5 mg via RESPIRATORY_TRACT
  Filled 2012-10-18 (×2): qty 0.5

## 2012-10-18 MED ORDER — BUPIVACAINE IN DEXTROSE 0.75-8.25 % IT SOLN
INTRATHECAL | Status: AC
  Filled 2012-10-18: qty 2

## 2012-10-18 MED ORDER — CEFAZOLIN SODIUM 1-5 GM-% IV SOLN
1.0000 g | Freq: Once | INTRAVENOUS | Status: AC
  Administered 2012-10-18 (×2): 1 g via INTRAVENOUS

## 2012-10-18 MED ORDER — MIDAZOLAM HCL 2 MG/2ML IJ SOLN
1.0000 mg | INTRAMUSCULAR | Status: DC | PRN
  Administered 2012-10-18: 2 mg via INTRAVENOUS

## 2012-10-18 MED ORDER — SODIUM CHLORIDE 0.9 % IR SOLN
Status: DC | PRN
  Administered 2012-10-18: 1000 mL

## 2012-10-18 MED ORDER — PROPOFOL 10 MG/ML IV EMUL
INTRAVENOUS | Status: AC
  Filled 2012-10-18: qty 20

## 2012-10-18 MED ORDER — EPINEPHRINE HCL 0.1 MG/ML IJ SOLN
INTRAMUSCULAR | Status: DC | PRN
  Administered 2012-10-18: .1 ug via INTRATRACHEAL

## 2012-10-18 MED ORDER — LACTATED RINGERS IV SOLN
INTRAVENOUS | Status: DC
  Administered 2012-10-18: 1000 mL via INTRAVENOUS

## 2012-10-18 MED ORDER — MIDAZOLAM HCL 2 MG/2ML IJ SOLN
INTRAMUSCULAR | Status: AC
  Filled 2012-10-18: qty 2

## 2012-10-18 SURGICAL SUPPLY — 45 items
BAG HAMPER (MISCELLANEOUS) ×2 IMPLANT
BIT DRILL 4.9 CANNULATED (BIT) ×1
BIT DRILL CANN QC 4.9 LRG (BIT) ×1 IMPLANT
BLADE SURG SZ10 CARB STEEL (BLADE) ×2 IMPLANT
CLOTH BEACON ORANGE TIMEOUT ST (SAFETY) ×2 IMPLANT
COVER LIGHT HANDLE STERIS (MISCELLANEOUS) ×4 IMPLANT
COVER MAYO STAND XLG (DRAPE) ×2 IMPLANT
DRAPE STERI IOBAN 125X83 (DRAPES) ×2 IMPLANT
DRILL BIT CANNULATED 4.9 (BIT) ×1
EVACUATOR 3/16  PVC DRAIN (DRAIN) ×1
EVACUATOR 3/16 PVC DRAIN (DRAIN) ×1 IMPLANT
GAUZE KERLIX 2  STERILE LF (GAUZE/BANDAGES/DRESSINGS) IMPLANT
GAUZE XEROFORM 5X9 LF (GAUZE/BANDAGES/DRESSINGS) ×2 IMPLANT
GLOVE BIO SURGEON STRL SZ8 (GLOVE) ×2 IMPLANT
GLOVE BIO SURGEON STRL SZ8.5 (GLOVE) ×2 IMPLANT
GLOVE BIOGEL PI IND STRL 8.5 (GLOVE) ×1 IMPLANT
GLOVE BIOGEL PI INDICATOR 8.5 (GLOVE) ×1
GLOVE INDICATOR 7.0 STRL GRN (GLOVE) ×8 IMPLANT
GLOVE SS BIOGEL STRL SZ 6.5 (GLOVE) ×3 IMPLANT
GLOVE SUPERSENSE BIOGEL SZ 6.5 (GLOVE) ×3
GOWN STRL REIN XL XLG (GOWN DISPOSABLE) ×8 IMPLANT
GUIDEWIRE CALB ASNIS (WIRE) ×8 IMPLANT
INST SET MAJOR BONE (KITS) ×2 IMPLANT
KIT BLADEGUARD II DBL (SET/KITS/TRAYS/PACK) ×2 IMPLANT
KIT ROOM TURNOVER APOR (KITS) ×2 IMPLANT
MANIFOLD NEPTUNE II (INSTRUMENTS) ×2 IMPLANT
MARKER SKIN DUAL TIP RULER LAB (MISCELLANEOUS) ×2 IMPLANT
NS IRRIG 1000ML POUR BTL (IV SOLUTION) ×2 IMPLANT
PACK BASIC III (CUSTOM PROCEDURE TRAY) ×1
PACK SRG BSC III STRL LF ECLPS (CUSTOM PROCEDURE TRAY) ×1 IMPLANT
PAD ABD 5X9 TENDERSORB (GAUZE/BANDAGES/DRESSINGS) ×4 IMPLANT
PENCIL HANDSWITCHING (ELECTRODE) ×2 IMPLANT
SCREW ASNIS 90MM (Screw) ×4 IMPLANT
SCREW ASNIS 95MM (Screw) ×2 IMPLANT
SET BASIN LINEN APH (SET/KITS/TRAYS/PACK) ×2 IMPLANT
SPONGE DRAIN TRACH 4X4 STRL 2S (GAUZE/BANDAGES/DRESSINGS) ×2 IMPLANT
SPONGE GAUZE 4X4 12PLY (GAUZE/BANDAGES/DRESSINGS) ×2 IMPLANT
SPONGE LAP 18X18 X RAY DECT (DISPOSABLE) ×2 IMPLANT
STAPLER VISISTAT 35W (STAPLE) ×2 IMPLANT
SUT BRALON NAB BRD #1 30IN (SUTURE) ×6 IMPLANT
SUT PLAIN 2 0 XLH (SUTURE) ×2 IMPLANT
SUT SILK 0 FSL (SUTURE) ×2 IMPLANT
SYR BULB IRRIGATION 50ML (SYRINGE) ×2 IMPLANT
TAPE MEDIFIX FOAM 3 (GAUZE/BANDAGES/DRESSINGS) ×2 IMPLANT
YANKAUER SUCT 12FT TUBE ARGYLE (SUCTIONS) ×2 IMPLANT

## 2012-10-18 NOTE — Clinical Social Work Placement (Signed)
Clinical Social Work Department CLINICAL SOCIAL WORK PLACEMENT NOTE 10/18/2012  Patient:  Jerry [name], Jerry [name]  Account Number:  [id number] Admit date:  10/17/2012  Clinical Social Worker:  Derenda Fennel, LCSW  Date/time:  10/18/2012 11:13 AM  Clinical Social Work is seeking post-discharge placement for this patient at the following level of care:   SKILLED NURSING   (*CSW will update this form in Epic as items are completed)   10/18/2012  Patient/family provided with [facility] Department of Clinical Social Work's list of facilities offering this level of care within the geographic area requested by the patient (or if unable, by the patient's family).  10/18/2012  Patient/family informed of their freedom to choose among providers that offer the needed level of care, that participate in Medicare, Medicaid or managed care program needed by the patient, have an available bed and are willing to accept the patient.  10/18/2012  Patient/family informed of [facility]' ownership interest in [facility], as well as of the fact that they are under no obligation to receive care at this facility.  PASARR submitted to EDS on 10/18/2012 PASARR number received from EDS on 10/18/2012  FL2 transmitted to all facilities in geographic area requested by pt/family on  10/18/2012 FL2 transmitted to all facilities within larger geographic area on   Patient informed that his/her managed care company has contracts with or will negotiate with  certain facilities, including the following:     Patient/family informed of bed offers received:   Patient chooses bed at  Physician recommends and patient chooses bed at    Patient to be transferred to  on   Patient to be transferred to facility by   The following physician request were entered in Epic:   Additional Comments:  Derenda Fennel, LCSW [phone]

## 2012-10-18 NOTE — Progress Notes (Signed)
UR Chart Review Completed  

## 2012-10-18 NOTE — Anesthesia Preprocedure Evaluation (Signed)
Anesthesia Evaluation  Patient identified by MRN, date of birth, ID band Patient awake    Reviewed: Allergy & Precautions, H&P , NPO status , Patient's Chart, lab work & pertinent test results, Unable to perform ROS - Chart review only  Airway Mallampati: II TM Distance: >3 FB     Dental  (+) Poor Dentition, Missing and Chipped   Pulmonary Current Smoker,  breath sounds clear to auscultation        Cardiovascular negative cardio ROS  Rhythm:Regular Rate:Normal     Neuro/Psych Mild dementia, follows some commands. \   GI/Hepatic negative GI ROS,   Endo/Other    Renal/GU      Musculoskeletal   Abdominal   Peds  Hematology   Anesthesia Other Findings   Reproductive/Obstetrics                           Anesthesia Physical Anesthesia Plan  ASA: II  Anesthesia Plan: Spinal   Post-op Pain Management:    Induction:   Airway Management Planned: Simple Face Mask  Additional Equipment:   Intra-op Plan:   Post-operative Plan:   Informed Consent: I have reviewed the patients History and Physical, chart, labs and discussed the procedure including the risks, benefits and alternatives for the proposed anesthesia with the patient or authorized representative who has indicated his/her understanding and acceptance.     Plan Discussed with:   Anesthesia Plan Comments:         Anesthesia Quick Evaluation

## 2012-10-18 NOTE — Progress Notes (Signed)
2 units PC verified available in blood bank for surgery. No family present at this time. Pt is oriented to place, time and situation.

## 2012-10-18 NOTE — Anesthesia Procedure Notes (Signed)
Date/Time: 10/18/2012 3:56 PM Performed by: Franco Nones Pre-anesthesia Checklist: Patient identified, Emergency Drugs available, Suction available, Timeout performed and Patient being monitored Patient Re-evaluated:Patient Re-evaluated prior to inductionOxygen Delivery Method: Nasal Cannula    Spinal  Patient location during procedure: OR Start time: 10/18/2012 4:05 PM End time: 10/18/2012 4:11 PM Staffing CRNA/Resident: Minerva Areola S Preanesthetic Checklist Completed: patient identified, site marked, surgical consent, pre-op evaluation, timeout performed, IV checked, risks and benefits discussed and monitors and equipment checked Spinal Block Patient position: left lateral decubitus Prep: Betadine Patient monitoring: heart rate, cardiac monitor, continuous pulse ox and blood pressure Approach: left paramedian Location: L3-4 Injection technique: single-shot Needle Needle type: Spinocan  Needle gauge: 22 G Needle length: 9 cm Assessment Sensory level: T8 (level at 1618) Additional Notes Betadine prep x3 1% lidocaine skinwheal  Clear CSF pre and post injection  ATTEMPTS: 2 TRAY ID: [id number] TRAY EXPIRATION DATE: 2014-07

## 2012-10-18 NOTE — Progress Notes (Signed)
[facility]             Progress Note   Subjective: Patient denies any complaints.  Pain appears to be controlled.  Objective: Vital signs in last 24 hours: Temp:  [97.2 F (36.2 C)-97.8 F (36.6 C)] 97.8 F (36.6 C) (12/18 0559) Pulse Rate:  [72-92] 72  (12/18 0559) Resp:  [18-20] 20  (12/18 0559) BP: (123-174)/(72-97) 129/76 mmHg (12/18 0559) SpO2:  [91 %-98 %] 98 % (12/18 0559) Weight:  [47.6 kg (104 lb 15 oz)] 47.6 kg (104 lb 15 oz) (12/17 1617) Weight change:  Last BM Date: 10/15/12  Intake/Output from previous day: 12/17 0701 - 12/18 0700 In: -  Out: 400 [Urine:400]     Physical Exam: General: Alert, awake,  in no acute distress. HEENT: No bruits, no goiter. Heart: Regular rate and rhythm, without murmurs, rubs, gallops. Lungs: Clear to auscultation bilaterally. Abdomen: Soft, nontender, nondistended, positive bowel sounds. Extremities: No clubbing cyanosis or edema with positive pedal pulses. Neuro: Grossly intact, nonfocal.    Lab Results: Basic Metabolic Panel:  Basename 10/18/12 0505 10/17/12 1326  NA 145 145  K 3.5 3.4*  CL 112 109  CO2 27 28  GLUCOSE 91 92  BUN 12 13  CREATININE 1.08 1.10  CALCIUM 9.0 10.0  MG -- --  PHOS -- --   Liver Function Tests: No results found for this basename: AST:2,ALT:2,ALKPHOS:2,BILITOT:2,PROT:2,ALBUMIN:2 in the last 72 hours No results found for this basename: LIPASE:2,AMYLASE:2 in the last 72 hours No results found for this basename: AMMONIA:2 in the last 72 hours CBC:  Basename 10/18/12 0505 10/17/12 1326  WBC 11.9* 15.6*  NEUTROABS -- 11.6*  HGB 11.7* 13.5  HCT 33.8* 39.0  MCV 83.7 83.2  PLT 170 178   Cardiac Enzymes: No results found for this basename: CKTOTAL:3,CKMB:3,CKMBINDEX:3,TROPONINI:3 in the last 72 hours BNP: No results found for this basename: PROBNP:3 in the last 72 hours D-Dimer: No results found for this basename: DDIMER:2 in the last 72 hours CBG: No results found for  this basename: GLUCAP:6 in the last 72 hours Hemoglobin A1C: No results found for this basename: HGBA1C in the last 72 hours Fasting Lipid Panel: No results found for this basename: CHOL,HDL,LDLCALC,TRIG,CHOLHDL,LDLDIRECT in the last 72 hours Thyroid Function Tests:  Basename 10/17/12 1326  TSH 2.283  T4TOTAL --  FREET4 --  T3FREE --  THYROIDAB --   Anemia Panel: No results found for this basename: VITAMINB12,FOLATE,FERRITIN,TIBC,IRON,RETICCTPCT in the last 72 hours Coagulation:  Basename 10/17/12 1326  LABPROT 14.0  INR 1.09   Urine Drug Screen: Drugs of Abuse  No results found for this basename: labopia, cocainscrnur, labbenz, amphetmu, thcu, labbarb    Alcohol Level: No results found for this basename: ETH:2 in the last 72 hours Urinalysis:  Basename 10/17/12 1426  COLORURINE YELLOW  LABSPEC 1.015  PHURINE 8.0  GLUCOSEU NEGATIVE  HGBUR LARGE*  BILIRUBINUR NEGATIVE  KETONESUR NEGATIVE  PROTEINUR 100*  UROBILINOGEN 2.0*  NITRITE POSITIVE*  LEUKOCYTESUR MODERATE*    Recent Results (from the past 240 hour(s))  SURGICAL PCR SCREEN     Status: Normal   Collection Time   10/18/12 12:01 AM      Component Value Range Status Comment   MRSA, PCR NEGATIVE  NEGATIVE Final    Staphylococcus aureus NEGATIVE  NEGATIVE Final     Studies/Results: Dg Chest 1 View  10/17/2012  *RADIOLOGY REPORT*  Clinical Data: Left hip fracture post fall  CHEST - 1 VIEW  Comparison: None  Findings: Skin  folds project over the chest bilaterally. Normal heart size, mediastinal contours, and pulmonary vascularity. Atherosclerotic calcification aorta. Lungs clear. Question underlying emphysematous changes. No pleural effusion or pneumothorax. Bones appear demineralized.  IMPRESSION: Question emphysematous changes. No acute abnormalities.   Original Report Authenticated By: Ulyses Southward, M.D.    Dg Hip Complete Left  10/17/2012  *RADIOLOGY REPORT*  Clinical Data: Left hip pain post fall  LEFT HIP  - COMPLETE 2+ VIEW  Comparison: None  Findings: Osseous demineralization. Hip joint spaces preserved. Subcapital left femoral neck fracture, not significantly displaced. No dislocation identified. Pelvis appears intact. Degenerative disc and facet disease changes lumbar spine. Scattered vascular calcifications.  IMPRESSION: Subcapital fracture left femoral neck.   Original Report Authenticated By: Ulyses Southward, M.D.     Medications: Scheduled Meds:   .  ceFAZolin (ANCEF) IV  2 g Intravenous Once  . cefTRIAXone (ROCEPHIN)  IV  1 g Intravenous Q24H  . influenza  inactive virus vaccine  0.5 mL Intramuscular Tomorrow-1000  . pneumococcal 23 valent vaccine  0.5 mL Intramuscular Tomorrow-1000  . povidone-iodine   Topical Once   Continuous Infusions:   . 0.9 % NaCl with KCl 20 mEq / L 75 mL/hr at 10/18/12 1054   PRN Meds:.acetaminophen, acetaminophen, haloperidol lactate, HYDROcodone-acetaminophen, morphine injection, ondansetron (ZOFRAN) IV, ondansetron  Assessment/Plan:  Principal Problem:  *Hip fracture, left Active Problems:  UTI (lower urinary tract infection)  Hypokalemia  Leukocytosis  1. Hip fracture.  For surgical repair today. 2. UTI.  On rocephin.  Follow up urine culture 3. Anemia, mild, continue to follow. 4. Dispo.  Physical therapy to work with patient after surgery.  Will likely need nursing home placement.  Time spent coordinating care:   LOS: 1 day   Richards Pherigo [facility] Pager: [phone] 10/18/2012, 12:15 PM

## 2012-10-18 NOTE — Anesthesia Postprocedure Evaluation (Signed)
Anesthesia Post Note  Patient: Jerry [name]  Procedure(s) Performed: Procedure(s) (LRB): CANNULATED HIP PINNING (Left)  Anesthesia type: General  Patient location: PACU  Post pain: Pain level controlled  Post assessment: Post-op Vital signs reviewed, Patient's Cardiovascular Status Stable, Respiratory Function Stable, Patent Airway, No signs of Nausea or vomiting and Pain level controlled  Last Vitals:  Filed Vitals:   10/18/12 1728  BP: 154/68  Pulse: 72  Temp: 36.5 C  Resp: 12    Post vital signs: Reviewed and stable  Level of consciousness: awake and alert   Complications: No apparent anesthesia complications

## 2012-10-18 NOTE — Brief Op Note (Signed)
10/17/2012 - 10/18/2012  5:09 PM  PATIENT:  Rodena Piety  78 y.o. male  PRE-OPERATIVE DIAGNOSIS:  left hip fx, subcapital, Garden II  POST-OPERATIVE DIAGNOSIS:  left hip fx  PROCEDURE:  Procedure(s) (LRB) with comments: CANNULATED HIP PINNING (Left) - Asnis Left Hip Pinning in situ  SURGEON:  Surgeon(s) and Role:    * Darreld Mclean, MD - Primary  PHYSICIAN ASSISTANT:   ASSISTANTS: Wrenn   ANESTHESIA:   spinal  EBL:  Total I/O In: 500 [I.V.:500] Out: 450 [Urine:400; Blood:50]  BLOOD ADMINISTERED:none  DRAINS: (Large) Hemovact drain(s) in the left hip with  Suction Open   LOCAL MEDICATIONS USED:  NONE  SPECIMEN:  No Specimen  DISPOSITION OF SPECIMEN:  N/A  COUNTS:  YES  TOURNIQUET:  * No tourniquets in log *  DICTATION: .Other Dictation: Dictation Number [id number]  PLAN OF CARE: Admit to inpatient   PATIENT DISPOSITION:  PACU - hemodynamically stable.   Delay start of Pharmacological VTE agent (>24hrs) due to surgical blood loss or risk of bleeding: no

## 2012-10-18 NOTE — Clinical Social Work Psychosocial (Signed)
Clinical Social Work Department BRIEF PSYCHOSOCIAL ASSESSMENT 10/18/2012  Patient:  Jerry [name], Jerry [name]     Account Number:  [id number]     Admit date:  10/17/2012  Clinical Social Worker:  Nancie Neas  Date/Time:  10/18/2012 11:15 AM  Referred by:  Physician  Date Referred:  10/18/2012 Referred for  SNF Placement   Other Referral:   Interview type:  Family Other interview type:   [place]- daughter-in-law    PSYCHOSOCIAL DATA Living Status:  FAMILY Admitted from facility:   Level of care:   Primary support name:  Greggory Stallion Primary support relationship to patient:  CHILD, ADULT Degree of support available:   supportive    CURRENT CONCERNS Current Concerns  Post-Acute Placement   Other Concerns:    SOCIAL WORK ASSESSMENT / PLAN CSW spoke with pt's daughter-in-law Adair Laundry as pt asleep. Adair Laundry reports pt has been living with them for about 4 years. He was living up [place] and [place], pt's son, went to visit and found him on the floor. He came back to live here with them. Greggory Stallion is durable POA per [place]. She states pt is very stubborn and will not use his walker or request help when he gets up. He falls frequently but has never had a fracture. Yesterday, he got up without help and fell in the living room, fracturing his hip. He is scheduled for surgery today. CSW discussed with Adair Laundry that pt will most likely require SNF at d/c and she agrees as both she and Greggory Stallion have their own health problems. CSW discussed placement process, including copays. She would like to keep pt in [place] if possible. SNF list left in room.   Assessment/plan status:  Psychosocial Support/Ongoing Assessment of Needs Other assessment/ plan:   Information/referral to community resources:   SNF list    PATIENT'S/FAMILY'S RESPONSE TO PLAN OF CARE: Pt unable to discuss plan of care at this time. Family report understanding of probable need for SNF at d/c. CSW to fax out FL2 and follow up with bed  offers when available.        Derenda Fennel, [place] [phone]

## 2012-10-18 NOTE — Transfer of Care (Addendum)
Immediate Anesthesia Transfer of Care Note  Patient: Jerry [name]  Procedure(s) Performed: Procedure(s) (LRB): CANNULATED HIP PINNING (Left)  Patient Location: PACU  Anesthesia Type: SAB  Level of Consciousness: awake  Airway & Oxygen Therapy: Patient Spontanous Breathing and non-rebreather face mask  Post-op Assessment: Report given to PACU RN, Post -op Vital signs reviewed and stable. SAB Level  T 8  Post vital signs: Reviewed and stable  Complications: No apparent anesthesia complications

## 2012-10-19 LAB — URINE CULTURE: Colony Count: >=100000

## 2012-10-19 LAB — BASIC METABOLIC PANEL
CO2: 20 mEq/L (ref 19–32)
Calcium: 8.5 mg/dL (ref 8.4–10.5)
Glucose, Bld: 84 mg/dL (ref 70–99)
Potassium: 4 mEq/L (ref 3.5–5.1)
Sodium: 146 mEq/L — ABNORMAL HIGH (ref 135–145)

## 2012-10-19 LAB — CBC WITH DIFFERENTIAL/PLATELET
Basophils Absolute: 0 10*3/uL (ref 0.0–0.1)
Basophils Relative: 0 % (ref 0–1)
Eosinophils Absolute: 0 10*3/uL (ref 0.0–0.7)
Eosinophils Relative: 0 % (ref 0–5)
Hemoglobin: 11.7 g/dL — ABNORMAL LOW (ref 13.0–17.0)
Lymphs Abs: 1.7 10*3/uL (ref 0.7–4.0)
MCV: 84.4 fL (ref 78.0–100.0)
Monocytes Relative: 9 % (ref 3–12)
Neutrophils Relative %: 77 % (ref 43–77)
Platelets: 207 10*3/uL (ref 150–400)
RBC: 4.03 MIL/uL — ABNORMAL LOW (ref 4.22–5.81)
WBC: 11.9 10*3/uL — ABNORMAL HIGH (ref 4.0–10.5)

## 2012-10-19 LAB — TYPE AND SCREEN
ABO/RH(D): O POS
Unit division: 0

## 2012-10-19 MED ORDER — ALBUTEROL SULFATE (5 MG/ML) 0.5% IN NEBU: 2.5000 mg | INHALATION_SOLUTION | Freq: Four times a day (QID) | RESPIRATORY_TRACT | Status: DC | PRN

## 2012-10-19 NOTE — Op Note (Signed)
NAMEJAX, Jerry [name] NO.:  [id number]  MEDICAL RECORD NO.:  [id number]  LOCATION:  A340                          FACILITY:  [facility]  PHYSICIAN:  J. Darreld Mclean, M.D. DATE OF BIRTH:  [birth year]  DATE OF PROCEDURE: DATE OF DISCHARGE:                              OPERATIVE REPORT   PREOPERATIVE DIAGNOSIS:  Subcapital fracture, left hip, Garden type 2, nondisplaced.  POSTOPERATIVE DIAGNOSIS:  Subcapital fracture, left hip, Garden type 2, nondisplaced.  PROCEDURE:  In situ pinning with cannulated hip screws, Asnis type of the left hip using screws measuring 90 mm, 90 mm, and 85 mm long.  ANESTHESIA:  Spinal.  DRAINS:  One large drain.  SURGEON:  J. Darreld Mclean, M.D.  ASSISTANT:  Dr. Annabell Howells.  TRANSFUSION:  No blood given.  The patient was given Ancef preoperatively.  Actually, it was given twice.  Order was written to have it done in the holding area, but he got Ancef earlier and given another dose of Ancef, a smaller dose per the Pharmacy, just prior to the procedure.  The patient also had sequential compression hose on preoperatively and during the case that could be used postoperatively and also we will start him on enoxaparin beginning tomorrow.  INDICATIONS:  The patient fell yesterday sustaining above-mentioned injury.  I talked to the patient and his son and daughter-in-law about the risks and imponderables of the procedure.  These include infection, embolization which will result in death, possible need for blood transfusion, need to go to a nursing home, and spinal anesthesia.  I also talked to them about avascular necrosis of the hips and they could develop due to the nature of the fracture.  They appeared to understand the procedure, asked appropriate questions, and agreed to the procedure.  DESCRIPTION OF PROCEDURE:  The patient was seen in the holding area and the left hip identified as correct surgical site.  He was taken to  the operating room and given spinal anesthesia and was placed on the fracture table.  C-arm fluoroscopy unit was brought in.  Everyone had lead aprons and appropriate tire, had x-ray badges.  Scout films were taken and deemed acceptable.  The patient was prepped and draped in usual manner.  Time-out identified [name] as the patient and we are doing his left hip Asnis hip pinning.  All instrumentation properly working and in position.  OR team knew each other.  Incision was made through skin, subcutaneous tissue, tensor fascia lata, and vastus lateralis.  Femoral shaft was identified.  Guide pin was placed with good AP and lateral views, and then two parallel screws were then placed additionally.  These measured 85 mm, 90 mm, and 90 mm.  X- rays looked good, AP and lateral views.  Hemovac drain was placed and sewn in with 2-0 silk.  Then, the vastus lateralis was reapproximated using running locking #1 Surgilon suture.  Tensor fascia lata was reapproximated using interrupted #1 Surgilon suture.  Subcutaneous tissue was reapproximated using 2-0 plain.  Skin reapproximated using skin staples.  The patient tolerated the procedure well and will go to recovery in good condition and will be readmitted back to the main part of the hospital.  ______________________________ Shela Commons. Darreld Mclean, M.D.     JWK/MEDQ  D:  10/18/2012  T:  10/19/2012  Job:  [id number]

## 2012-10-19 NOTE — Progress Notes (Signed)
[facility]             Progress Note   Subjective: Patient denies any complaints.  Pain appears to be controlled. Sitting up in chair.  Objective: Vital signs in last 24 hours: Temp:  [96.8 F (36 C)-98.8 F (37.1 C)] 98.6 F (37 C) (12/19 0542) Pulse Rate:  [66-88] 73  (12/19 0542) Resp:  [10-31] 20  (12/19 0542) BP: (152-197)/(66-109) 164/70 mmHg (12/19 0542) SpO2:  [85 %-100 %] 98 % (12/19 0834) Weight change:  Last BM Date: 10/17/12  Intake/Output from previous day: 12/18 0701 - 12/19 0700 In: 3382.5 [I.V.:3282.5; IV Piggyback:100] Out: 850 [Urine:800; Blood:50]     Physical Exam: General: Alert, awake,  in no acute distress. HEENT: No bruits, no goiter. Heart: Regular rate and rhythm, without murmurs, rubs, gallops. Lungs: Clear to auscultation bilaterally. Abdomen: Soft, nontender, nondistended, positive bowel sounds. Extremities: No clubbing cyanosis or edema with positive pedal pulses. Neuro: Grossly intact, nonfocal.    Lab Results: Basic Metabolic Panel:  Basename 10/19/12 0510 10/18/12 0505  NA 146* 145  K 4.0 3.5  CL 115* 112  CO2 20 27  GLUCOSE 84 91  BUN 12 12  CREATININE 0.94 1.08  CALCIUM 8.5 9.0  MG -- --  PHOS -- --   Liver Function Tests: No results found for this basename: AST:2,ALT:2,ALKPHOS:2,BILITOT:2,PROT:2,ALBUMIN:2 in the last 72 hours No results found for this basename: LIPASE:2,AMYLASE:2 in the last 72 hours No results found for this basename: AMMONIA:2 in the last 72 hours CBC:  Basename 10/19/12 0510 10/18/12 0505 10/17/12 1326  WBC 11.9* 11.9* --  NEUTROABS 9.1* -- 11.6*  HGB 11.7* 11.7* --  HCT 34.0* 33.8* --  MCV 84.4 83.7 --  PLT 207 170 --   Cardiac Enzymes: No results found for this basename: CKTOTAL:3,CKMB:3,CKMBINDEX:3,TROPONINI:3 in the last 72 hours BNP: No results found for this basename: PROBNP:3 in the last 72 hours D-Dimer: No results found for this basename: DDIMER:2 in the last 72  hours CBG: No results found for this basename: GLUCAP:6 in the last 72 hours Hemoglobin A1C: No results found for this basename: HGBA1C in the last 72 hours Fasting Lipid Panel: No results found for this basename: CHOL,HDL,LDLCALC,TRIG,CHOLHDL,LDLDIRECT in the last 72 hours Thyroid Function Tests:  Basename 10/17/12 1326  TSH 2.283  T4TOTAL --  FREET4 --  T3FREE --  THYROIDAB --   Anemia Panel: No results found for this basename: VITAMINB12,FOLATE,FERRITIN,TIBC,IRON,RETICCTPCT in the last 72 hours Coagulation:  Basename 10/17/12 1326  LABPROT 14.0  INR 1.09   Urine Drug Screen: Drugs of Abuse  No results found for this basename: labopia,  cocainscrnur,  labbenz,  amphetmu,  thcu,  labbarb    Alcohol Level: No results found for this basename: ETH:2 in the last 72 hours Urinalysis:  Basename 10/17/12 1426  COLORURINE YELLOW  LABSPEC 1.015  PHURINE 8.0  GLUCOSEU NEGATIVE  HGBUR LARGE*  BILIRUBINUR NEGATIVE  KETONESUR NEGATIVE  PROTEINUR 100*  UROBILINOGEN 2.0*  NITRITE POSITIVE*  LEUKOCYTESUR MODERATE*    Recent Results (from the past 240 hour(s))  URINE CULTURE     Status: Normal (Preliminary result)   Collection Time   10/17/12  2:26 PM      Component Value Range Status Comment   Specimen Description URINE, CATHETERIZED   Final    Special Requests NONE   Final    Culture  Setup Time 10/17/2012 17:50   Final    Colony Count >=100,000 COLONIES/ML   Final    Culture  GRAM NEGATIVE RODS   Final    Report Status PENDING   Incomplete   SURGICAL PCR SCREEN     Status: Normal   Collection Time   10/18/12 12:01 AM      Component Value Range Status Comment   MRSA, PCR NEGATIVE  NEGATIVE Final    Staphylococcus aureus NEGATIVE  NEGATIVE Final     Studies/Results: Dg Chest 1 View  10/17/2012  *RADIOLOGY REPORT*  Clinical Data: Left hip fracture post fall  CHEST - 1 VIEW  Comparison: None  Findings: Skin folds project over the chest bilaterally. Normal heart size,  mediastinal contours, and pulmonary vascularity. Atherosclerotic calcification aorta. Lungs clear. Question underlying emphysematous changes. No pleural effusion or pneumothorax. Bones appear demineralized.  IMPRESSION: Question emphysematous changes. No acute abnormalities.   Original Report Authenticated By: Ulyses Southward, M.D.    Dg Hip Complete Left  10/17/2012  *RADIOLOGY REPORT*  Clinical Data: Left hip pain post fall  LEFT HIP - COMPLETE 2+ VIEW  Comparison: None  Findings: Osseous demineralization. Hip joint spaces preserved. Subcapital left femoral neck fracture, not significantly displaced. No dislocation identified. Pelvis appears intact. Degenerative disc and facet disease changes lumbar spine. Scattered vascular calcifications.  IMPRESSION: Subcapital fracture left femoral neck.   Original Report Authenticated By: Ulyses Southward, M.D.    Dg Hip Operative Left  10/18/2012  *RADIOLOGY REPORT*  Clinical Data: ORIF subcapital left femoral neck fracture.  OPERATIVE LEFT HIP 2 VIEWS 10/18/2012:  Comparison: Left hip x-rays yesterday.  Findings: 3 spot images from the C-arm fluoroscopic device, AP and lateral views of the left hip, were submitted for interpretation post-operatively.  3 cannulated screws have been placed across the subcapital left femoral neck fracture.  Alignment appears anatomic.  IMPRESSION: ORIF subcapital left femoral neck fracture.   Original Report Authenticated By: Hulan Saas, M.D.     Medications: Scheduled Meds:    . cefTRIAXone (ROCEPHIN)  IV  1 g Intravenous Q24H  . enoxaparin (LOVENOX) injection  40 mg Subcutaneous Q24H   Continuous Infusions:  PRN Meds:.acetaminophen, acetaminophen, albuterol, haloperidol lactate, HYDROcodone-acetaminophen, magnesium hydroxide, morphine injection, ondansetron (ZOFRAN) IV, ondansetron, promethazine  Assessment/Plan:  Principal Problem:  *Hip fracture, left Active Problems:  UTI (lower urinary tract infection)  Hypokalemia   Leukocytosis  1. Hip fracture.  S/p surgical repair.  Working with physical therapy. Will give incentive spirometer. 2. UTI.  On rocephin. Urine culture shows GNR.  Continue rocephin. 3. Anemia, mild, continue to follow. 4. Dispo.  Will likely need nursing home placement. Discharge when cleared by ortho.  Time spent coordinating care:   LOS: 2 days   MEMON,JEHANZEB [facility] Pager: [phone] 10/19/2012, 12:53 PM

## 2012-10-19 NOTE — Clinical Social Work Placement (Signed)
Clinical Social Work Department CLINICAL SOCIAL WORK PLACEMENT NOTE 10/19/2012  Patient:  Jerry [name], Jerry [name]  Account Number:  [id number] Admit date:  10/17/2012  Clinical Social Worker:  Derenda Fennel, LCSW  Date/time:  10/18/2012 11:13 AM  Clinical Social Work is seeking post-discharge placement for this patient at the following level of care:   SKILLED NURSING   (*CSW will update this form in Epic as items are completed)   10/18/2012  Patient/family provided with [facility] Department of Clinical Social Work's list of facilities offering this level of care within the geographic area requested by the patient (or if unable, by the patient's family).  10/18/2012  Patient/family informed of their freedom to choose among providers that offer the needed level of care, that participate in Medicare, Medicaid or managed care program needed by the patient, have an available bed and are willing to accept the patient.  10/18/2012  Patient/family informed of [facility]' ownership interest in [facility], as well as of the fact that they are under no obligation to receive care at this facility.  PASARR submitted to EDS on 10/18/2012 PASARR number received from [employer] on 10/18/2012  FL2 transmitted to all facilities in geographic area requested by pt/family on  10/18/2012 FL2 transmitted to all facilities within larger geographic area on   Patient informed that his/her managed care company has contracts with or will negotiate with  certain facilities, including the following:     Patient/family informed of bed offers received:  10/19/2012 Patient chooses bed at [facility] Physician recommends and patient chooses bed at  [facility]  Patient to be transferred to  on   Patient to be transferred to facility by   The following physician request were entered in Epic:   Additional Comments:  Derenda Fennel, LCSW [phone]

## 2012-10-19 NOTE — Evaluation (Signed)
Physical Therapy Evaluation Patient Details Name: Jerry [name] MRN: [id number] DOB: 07/20/1916 Today's Date: 10/19/2012 Time: 4098-1191 PT Time Calculation (min): 45 min  PT Assessment / Plan / Recommendation Clinical Impression  Pt was seen for initial eval/tx and was tolerated well.  He is alert and oriented, cooperative and well motivated.  His mobility is currently very limited due to recent hip fx., but he was able to participate in gentle hip ROM and transfer bed to chair.  He will definately need SNF as he was living alone, independently, PTA.    PT Assessment  Patient needs continued PT services    Follow Up Recommendations  SNF    Does the patient have the potential to tolerate intense rehabilitation      Barriers to Discharge Decreased caregiver support      Equipment Recommendations  Rolling walker with 5" wheels    Recommendations for Other Services OT consult   Frequency Min 6X/week    Precautions / Restrictions Precautions Precautions: Fall Restrictions Weight Bearing Restrictions: Yes LLE Weight Bearing: Touchdown weight bearing   Pertinent Vitals/Pain       Mobility  Bed Mobility Bed Mobility: Supine to Sit;Sit to Supine Supine to Sit: 2: Max assist;HOB elevated Sit to Supine: Not Tested (comment) Transfers Transfers: Sit to Stand;Stand to Sit;Stand Pivot Transfers Sit to Stand: 2: Max assist;From bed;With upper extremity assist Stand to Sit: 2: Max assist;With upper extremity assist;To bed Stand Pivot Transfers: 2: Max assist Ambulation/Gait Ambulation/Gait Assistance: Not tested (comment)    Shoulder Instructions     Exercises General Exercises - Lower Extremity Ankle Circles/Pumps: AROM;Both;10 reps;Supine Short Arc Quad: AROM;Left;10 reps;Supine Heel Slides: AAROM;Both;10 reps;Supine Hip ABduction/ADduction: AAROM;Both;10 reps;Supine   PT Diagnosis: Difficulty walking;Abnormality of gait;Generalized weakness;Acute pain  PT Problem  List: Decreased strength;Decreased range of motion;Decreased activity tolerance;Decreased balance;Decreased mobility;Decreased knowledge of use of DME;Decreased safety awareness;Decreased knowledge of precautions;Pain PT Treatment Interventions: DME instruction;Gait training;Functional mobility training;Therapeutic activities;Therapeutic exercise;Patient/family education   PT Goals Acute Rehab PT Goals PT Goal Formulation: With patient Time For Goal Achievement: 11/02/12 Potential to Achieve Goals: Good Pt will go Supine/Side to Sit: with mod assist;with HOB not 0 degrees (comment degree) PT Goal: Supine/Side to Sit - Progress: Goal set today Pt will go Sit to Supine/Side: with mod assist;with HOB not 0 degrees (comment degree) PT Goal: Sit to Supine/Side - Progress: Goal set today Pt will go Sit to Stand: with mod assist;with upper extremity assist PT Goal: Sit to Stand - Progress: Goal set today Pt will go Stand to Sit: with mod assist;with upper extremity assist PT Goal: Stand to Sit - Progress: Goal set today Pt will Transfer Bed to Chair/Chair to Bed: with mod assist PT Transfer Goal: Bed to Chair/Chair to Bed - Progress: Goal set today Pt will Ambulate: 1 - 15 feet;with mod assist;with rolling walker PT Goal: Ambulate - Progress: Goal set today  Visit Information  Last PT Received On: 10/19/12    Subjective Data  Subjective: my hip only hurts when I move it Patient Stated Goal: retrun home   Prior Functioning  Home Living Lives With: Alone Available Help at Discharge: Other (Comment) (pt could not identify anyone) Type of Home: House Home Access: Stairs to enter [employer] of Steps: 2 Entrance Stairs-Rails: None Home Layout: One level Home Adaptive Equipment: None Prior Function Level of Independence: Independent Able to Take Stairs?: Yes Driving: No Vocation: Retired Musician: No difficulties    Cognition  Overall Cognitive Status:  Appears  within functional limits for tasks assessed/performed Arousal/Alertness: Awake/alert Orientation Level: Appears intact for tasks assessed Behavior During Session: [facility] for tasks performed Cognition - Other Comments: pt is very well motivated and cooperative    Extremity/Trunk Assessment Right Upper Extremity Assessment RUE ROM/Strength/Tone: WFL for tasks assessed RUE Sensation: WFL - Light Touch RUE Coordination: WFL - gross motor Left Upper Extremity Assessment LUE ROM/Strength/Tone: WFL for tasks assessed LUE Sensation: WFL - Light Touch LUE Coordination: WFL - gross motor Right Lower Extremity Assessment RLE ROM/Strength/Tone: WFL for tasks assessed RLE Sensation: WFL - Light Touch RLE Coordination: WFL - gross motor Left Lower Extremity Assessment LLE ROM/Strength/Tone: Deficits;Unable to fully assess;Due to pain LLE ROM/Strength/Tone Deficits: limited due to recent surgery LLE Sensation: [facility] - Light Touch Trunk Assessment Trunk Assessment: Kyphotic   Balance Balance Balance Assessed: No  End of Session PT - End of Session Equipment Utilized During Treatment: Gait belt Activity Tolerance: Patient tolerated treatment well Patient left: in chair;with call bell/phone within reach;with chair alarm set Nurse Communication: Mobility status  GP     Konrad Penta 10/19/2012, 11:54 AM

## 2012-10-19 NOTE — Clinical Social Work Note (Signed)
CSW presented bed offers and pt's family choose [facility]. Facility notified. PT notes sent for facility to begin insurance authorization.  Derenda Fennel, [place] [phone]

## 2012-10-19 NOTE — Addendum Note (Signed)
Addendum  created 10/19/12 1448 by Franco Nones, CRNA   Modules edited:Notes Section

## 2012-10-19 NOTE — Care Management Note (Unsigned)
    Page 1 of 1   10/19/2012     2:49:04 PM   CARE MANAGEMENT NOTE 10/19/2012  Patient:  Jerry [name], Jerry [name]   Account Number:  [id number]  Date Initiated:  10/19/2012  Documentation initiated by:  Rosemary Holms  Subjective/Objective Assessment:   Pt fell at home and fx left hip. Post op and plans to DC to SNF     Action/Plan:   Anticipated DC Date:  10/22/2012   Anticipated DC Plan:  SKILLED NURSING FACILITY      DC Planning Services  CM consult      Choice offered to / List presented to:             Status of service:  In process, will continue to follow Medicare Important Message given?   (If response is "NO", the following Medicare IM given date fields will be blank) Date Medicare IM given:   Date Additional Medicare IM given:    Discharge Disposition:    Per UR Regulation:    If discussed at Long Length of Stay Meetings, dates discussed:    Comments:  10/19/12 Rosemary Holms RN BSN CM

## 2012-10-19 NOTE — Progress Notes (Signed)
Subjective: 1 Day Post-Op Procedure(s) (LRB): CANNULATED HIP PINNING (Left) Patient reports pain as 4 on 0-10 scale.    Objective: Vital signs in last 24 hours: Temp:  [96.8 F (36 C)-98.8 F (37.1 C)] 98.6 F (37 C) (12/19 0542) Pulse Rate:  [66-88] 73  (12/19 0542) Resp:  [10-31] 20  (12/19 0542) BP: (152-197)/(66-109) 164/70 mmHg (12/19 0542) SpO2:  [85 %-100 %] 97 % (12/19 0542)  Intake/Output from previous day: 12/18 0701 - 12/19 0700 In: 3382.5 [I.V.:3282.5; IV Piggyback:100] Out: 850 [Urine:800; Blood:50] Intake/Output this shift:     Basename 10/19/12 0510 10/18/12 0505 10/17/12 1326  HGB 11.7* 11.7* 13.5    Basename 10/19/12 0510 10/18/12 0505  WBC 11.9* 11.9*  RBC 4.03* 4.04*  HCT 34.0* 33.8*  PLT 207 170    Basename 10/19/12 0510 10/18/12 0505  NA 146* 145  K 4.0 3.5  CL 115* 112  CO2 20 27  BUN 12 12  CREATININE 0.94 1.08  GLUCOSE 84 91  CALCIUM 8.5 9.0    Basename 10/17/12 1326  LABPT --  INR 1.09    Neurovascular intact Sensation intact distally Intact pulses distally Dorsiflexion/Plantar flexion intact  He is confused (chronic state).  To try physical therapy today.  Assessment/Plan: 1 Day Post-Op Procedure(s) (LRB): CANNULATED HIP PINNING (Left) Up with therapy  Nohea Kras 10/19/2012, 7:30 AM

## 2012-10-19 NOTE — Anesthesia Postprocedure Evaluation (Signed)
Anesthesia Post Note  Patient: Jerry [name]  Procedure(s) Performed: Procedure(s) (LRB): CANNULATED HIP PINNING (Left)  Anesthesia type: Spinal  Patient location: 340  Post pain: Pain level controlled  Post assessment: Post-op Vital signs reviewed, Patient's Cardiovascular Status Stable, Respiratory Function Stable, Patent Airway, No signs of Nausea or vomiting and Pain level controlled  Last Vitals:  Filed Vitals:   10/19/12 0542  BP: 164/70  Pulse: 73  Temp: 37 C  Resp: 20    Post vital signs: Reviewed and stable  Level of consciousness: awake and alert   Complications: No apparent anesthesia complications

## 2012-10-20 ENCOUNTER — Inpatient Hospital Stay (HOSPITAL_COMMUNITY): Payer: Medicare PPO

## 2012-10-20 ENCOUNTER — Encounter (HOSPITAL_COMMUNITY): Payer: Self-pay | Admitting: Orthopaedic Surgery

## 2012-10-20 LAB — CBC WITH DIFFERENTIAL/PLATELET
Basophils Absolute: 0 10*3/uL (ref 0.0–0.1)
Eosinophils Absolute: 0 10*3/uL (ref 0.0–0.7)
Lymphocytes Relative: 18 % (ref 12–46)
MCH: 28.4 pg (ref 26.0–34.0)
MCHC: 34 g/dL (ref 30.0–36.0)
Monocytes Absolute: 1.8 10*3/uL — ABNORMAL HIGH (ref 0.1–1.0)
Neutrophils Relative %: 68 % (ref 43–77)
Platelets: 241 10*3/uL (ref 150–400)
RDW: 14.9 % (ref 11.5–15.5)

## 2012-10-20 LAB — BASIC METABOLIC PANEL
BUN: 12 mg/dL (ref 6–23)
Calcium: 9.1 mg/dL (ref 8.4–10.5)
Creatinine, Ser: 1.15 mg/dL (ref 0.50–1.35)
GFR calc Af Amer: 61 mL/min — ABNORMAL LOW (ref >90)
GFR calc non Af Amer: 53 mL/min — ABNORMAL LOW (ref >90)
Potassium: 4.6 mEq/L (ref 3.5–5.1)

## 2012-10-20 MED ORDER — DEXTROSE 5 % IV SOLN
INTRAVENOUS | Status: DC
  Administered 2012-10-20: 16:00:00 via INTRAVENOUS

## 2012-10-20 MED ORDER — SODIUM CHLORIDE 0.9 % IJ SOLN: 10.0000 mL | INTRAMUSCULAR | Status: DC | PRN

## 2012-10-20 MED ORDER — DEXTROSE 5 % IV SOLN
INTRAVENOUS | Status: DC
  Filled 2012-10-20: qty 1000

## 2012-10-20 MED ORDER — ENOXAPARIN SODIUM 30 MG/0.3ML ~~LOC~~ SOLN
30.0000 mg | SUBCUTANEOUS | Status: DC
  Administered 2012-10-20: 30 mg via SUBCUTANEOUS
  Filled 2012-10-20: qty 0.3

## 2012-10-20 MED ORDER — ENOXAPARIN SODIUM 30 MG/0.3ML ~~LOC~~ SOLN: 30.0000 mg | SUBCUTANEOUS | Status: DC

## 2012-10-20 MED ORDER — SODIUM CHLORIDE 0.9 % IJ SOLN
10.0000 mL | Freq: Two times a day (BID) | INTRAMUSCULAR | Status: DC
  Administered 2012-10-20 – 2012-10-21 (×2): 10 mL

## 2012-10-20 NOTE — Progress Notes (Signed)
Subjective: 2 Days Post-Op Procedure(s) (LRB): CANNULATED HIP PINNING (Left) Patient reports pain as 2 on 0-10 scale.    Objective: Vital signs in last 24 hours: Temp:  [97.1 F (36.2 C)-97.5 F (36.4 C)] 97.1 F (36.2 C) (12/20 0500) Pulse Rate:  [62-65] 65  (12/20 0500) Resp:  [18] 18  (12/19 2100) BP: (149-178)/(84) 149/84 mmHg (12/20 0500) SpO2:  [98 %-100 %] 100 % (12/20 0500)  Intake/Output from previous day: 12/19 0701 - 12/20 0700 In: 480 [P.O.:480] Out: 950 [Urine:950] Intake/Output this shift:     Basename 10/20/12 0513 10/19/12 0510 10/18/12 0505 10/17/12 1326  HGB 11.0* 11.7* 11.7* 13.5    Basename 10/20/12 0513 10/19/12 0510  WBC 12.8* 11.9*  RBC 3.87* 4.03*  HCT 32.4* 34.0*  PLT 241 207    Basename 10/20/12 0513 10/19/12 0510  NA 148* 146*  K 4.6 4.0  CL 117* 115*  CO2 27 20  BUN 12 12  CREATININE 1.15 0.94  GLUCOSE 108* 84  CALCIUM 9.1 8.5    Basename 10/17/12 1326  LABPT --  INR 1.09    Neurovascular intact Sensation intact distally Intact pulses distally Dorsiflexion/Plantar flexion intact Incision: scant drainage No cellulitis present Compartment soft  Assessment/Plan: 2 Days Post-Op Procedure(s) (LRB): CANNULATED HIP PINNING (Left) Discharge to SNF when bed available.  He will need staples removed and Steri-strip wound on 10/28/12.  He will need PT, toe touch only left hip.  If unable to do because of confusion, then will need bed to chair to bed transfers.  He will need to stay on enoxaparin daily for one month.  I will need to see in office in one month.  Call for appointment.  Berish Bohman 10/20/2012, 7:53 AM

## 2012-10-20 NOTE — Progress Notes (Signed)
[facility]             Progress Note   Subjective: Patient complaining of pain in his leg today.  Objective: Vital signs in last 24 hours: Temp:  [97.1 F (36.2 C)-99.2 F (37.3 C)] 99.2 F (37.3 C) (12/20 1330) Pulse Rate:  [62-98] 98  (12/20 1330) Resp:  [18] 18  (12/20 1330) BP: (149-186)/(84-90) 186/90 mmHg (12/20 1330) SpO2:  [97 %-100 %] 97 % (12/20 1330) Weight change:  Last BM Date: 10/17/12  Intake/Output from previous day: 12/19 0701 - 12/20 0700 In: 480 [P.O.:480] Out: 950 [Urine:950] Total I/O In: 240 [P.O.:240] Out: -    Physical Exam: General: Alert, awake,  appears uncomfortable due to pain HEENT: No bruits, no goiter. Heart: Regular rate and rhythm, without murmurs, rubs, gallops. Lungs: Clear to auscultation bilaterally. Abdomen: Soft, nontender, nondistended, positive bowel sounds. Extremities: No clubbing cyanosis or edema with positive pedal pulses. Neuro: Grossly intact, nonfocal.    Lab Results: Basic Metabolic Panel:  Basename 10/20/12 0513 10/19/12 0510  NA 148* 146*  K 4.6 4.0  CL 117* 115*  CO2 27 20  GLUCOSE 108* 84  BUN 12 12  CREATININE 1.15 0.94  CALCIUM 9.1 8.5  MG -- --  PHOS -- --   Liver Function Tests: No results found for this basename: AST:2,ALT:2,ALKPHOS:2,BILITOT:2,PROT:2,ALBUMIN:2 in the last 72 hours No results found for this basename: LIPASE:2,AMYLASE:2 in the last 72 hours No results found for this basename: AMMONIA:2 in the last 72 hours CBC:  Basename 10/20/12 0513 10/19/12 0510  WBC 12.8* 11.9*  NEUTROABS 8.7* 9.1*  HGB 11.0* 11.7*  HCT 32.4* 34.0*  MCV 83.7 84.4  PLT 241 207   Cardiac Enzymes: No results found for this basename: CKTOTAL:3,CKMB:3,CKMBINDEX:3,TROPONINI:3 in the last 72 hours BNP: No results found for this basename: PROBNP:3 in the last 72 hours D-Dimer: No results found for this basename: DDIMER:2 in the last 72 hours CBG: No results found for this basename:  GLUCAP:6 in the last 72 hours Hemoglobin A1C: No results found for this basename: HGBA1C in the last 72 hours Fasting Lipid Panel: No results found for this basename: CHOL,HDL,LDLCALC,TRIG,CHOLHDL,LDLDIRECT in the last 72 hours Thyroid Function Tests: No results found for this basename: TSH,T4TOTAL,FREET4,T3FREE,THYROIDAB in the last 72 hours Anemia Panel: No results found for this basename: VITAMINB12,FOLATE,FERRITIN,TIBC,IRON,RETICCTPCT in the last 72 hours Coagulation: No results found for this basename: LABPROT:2,INR:2 in the last 72 hours Urine Drug Screen: Drugs of Abuse  No results found for this basename: labopia,  cocainscrnur,  labbenz,  amphetmu,  thcu,  labbarb    Alcohol Level: No results found for this basename: ETH:2 in the last 72 hours Urinalysis: No results found for this basename: COLORURINE:2,APPERANCEUR:2,LABSPEC:2,PHURINE:2,GLUCOSEU:2,HGBUR:2,BILIRUBINUR:2,KETONESUR:2,PROTEINUR:2,UROBILINOGEN:2,NITRITE:2,LEUKOCYTESUR:2 in the last 72 hours  Recent Results (from the past 240 hour(s))  URINE CULTURE     Status: Normal   Collection Time   10/17/12  2:26 PM      Component Value Range Status Comment   Specimen Description URINE, CATHETERIZED   Final    Special Requests NONE   Final    Culture  Setup Time 10/17/2012 17:50   Final    Colony Count >=100,000 COLONIES/ML   Final    Culture ESCHERICHIA COLI   Final    Report Status 10/19/2012 FINAL   Final    Organism ID, Bacteria ESCHERICHIA COLI   Final   SURGICAL PCR SCREEN     Status: Normal   Collection Time   10/18/12 12:01 AM  Component Value Range Status Comment   MRSA, PCR NEGATIVE  NEGATIVE Final    Staphylococcus aureus NEGATIVE  NEGATIVE Final     Studies/Results: Dg Hip Operative Left  10/18/2012  *RADIOLOGY REPORT*  Clinical Data: ORIF subcapital left femoral neck fracture.  OPERATIVE LEFT HIP 2 VIEWS 10/18/2012:  Comparison: Left hip x-rays yesterday.  Findings: 3 spot images from the C-arm  fluoroscopic device, AP and lateral views of the left hip, were submitted for interpretation post-operatively.  3 cannulated screws have been placed across the subcapital left femoral neck fracture.  Alignment appears anatomic.  IMPRESSION: ORIF subcapital left femoral neck fracture.   Original Report Authenticated By: Hulan Saas, M.D.    Dg Chest Port 1 View  10/20/2012  *RADIOLOGY REPORT*  Clinical Data: Line placement  PORTABLE CHEST - 1 VIEW  Comparison: Portable exam 1434 hours compared to 10/17/2012  Findings: Severely rotated to the left. Right arm PICC line, tip projecting over SVC. Enlargement of cardiac silhouette. Atherosclerotic calcification aorta. Skin folds project over left chest. Question mild elevation of left diaphragm. Emphysematous changes suspected without gross infiltrate or effusion. No pneumothorax or acute osseous findings.  IMPRESSION: Tip of right arm PICC line projects over SVC. Enlargement of cardiac silhouette. Emphysematous changes suspected.   Original Report Authenticated By: Ulyses Southward, M.D.     Medications: Scheduled Meds:    . cefTRIAXone (ROCEPHIN)  IV  1 g Intravenous Q24H  . enoxaparin (LOVENOX) injection  30 mg Subcutaneous Q24H  . sodium chloride  10-40 mL Intracatheter Q12H   Continuous Infusions:    . dextrose 75 mL/hr at 10/20/12 1557   PRN Meds:.acetaminophen, acetaminophen, albuterol, haloperidol lactate, HYDROcodone-acetaminophen, magnesium hydroxide, morphine injection, ondansetron (ZOFRAN) IV, ondansetron, promethazine, sodium chloride  Assessment/Plan:  Principal Problem:  *Hip fracture, left Active Problems:  UTI (lower urinary tract infection)  Hypokalemia  Leukocytosis  1. Hip fracture.  S/p surgical repair.  Working with physical therapy. He has been cleared for discharge by orthopedics. He'll need to followup with Dr. Hilda Lias in one month. His staples can be removed on the 28th and replaced with Steri-Strips. He will need to  be on Lovenox for the next month for DVT prophylaxis. 2. UTI. Escherichia coli. On rocephin. Bacteria is resistant to multiple oral antibiotics. Patient replaced with PICC line to complete course of Rocephin as an outpatient. 3. Anemia, mild, continue to follow. 4. Dispo.  Likely discharge tomorrow to nursing Center. 5. Hypernatremia. Likely iatrogenic from the saline infusion. We'll change fluids to D5.  Time spent coordinating care:   LOS: 3 days   MEMON,JEHANZEB [facility] Pager: [phone] 10/20/2012, 5:12 PM

## 2012-10-20 NOTE — Clinical Social Work Note (Signed)
Per Lynnea Ferrier at [facility], authorization received for SNF from [employer]. MD feels pt should be ready for d/c tomorrow. Pt getting PICC line today. Facility aware of PICC and potential d/c tomorrow. FL2 updated.   Derenda Fennel, [place] [phone]

## 2012-10-20 NOTE — Progress Notes (Signed)
Physical Therapy Treatment Patient Details Name: Jerry [name] MRN: [id number] DOB: 1916/10/19 Today's Date: 10/20/2012 Time: 8295-6213 PT Time Calculation (min): 40 min  PT Assessment / Plan / Recommendation Comments on Treatment Session  Pt is somewhat drowsy today, but remains very cooperative.  He was able to participate in all ex to a min degree due to drowsiness.  Mas assist still needed to transfer bed to chair.  He is tolerating sitting very well.    Follow Up Recommendations        Does the patient have the potential to tolerate intense rehabilitation     Barriers to Discharge        Equipment Recommendations       Recommendations for Other Services    Frequency     Plan Discharge plan remains appropriate;Frequency remains appropriate    Precautions / Restrictions     Pertinent Vitals/Pain     Mobility  Bed Mobility Supine to Sit: 2: Max assist Sit to Supine: Not Tested (comment) Transfers Stand Pivot Transfers: 2: Max assist Ambulation/Gait Ambulation/Gait Assistance: Not tested (comment) Ambulation/Gait Assistance Details: unable as yet to try to ambulate    Exercises General Exercises - Lower Extremity Ankle Circles/Pumps: AROM;Both;10 reps;Supine Quad Sets: AROM;Both;5 reps;Supine (pt had difficulty understanding exercise) Short Arc Quad: AAROM;Both;10 reps;Supine Heel Slides: AAROM;Both;10 reps;Supine Hip ABduction/ADduction: AAROM;Both;10 reps;Supine   PT Diagnosis:    PT Problem List:   PT Treatment Interventions:     PT Goals Acute Rehab PT Goals PT Goal: Supine/Side to Sit - Progress: Progressing toward goal PT Transfer Goal: Bed to Chair/Chair to Bed - Progress: Progressing toward goal  Visit Information  Last PT Received On: 10/20/12    Subjective Data  Subjective: I didn't sleep well last night   Cognition       Balance     End of Session PT - End of Session Equipment Utilized During Treatment: Gait belt Activity  Tolerance: Patient tolerated treatment well Patient left: in chair;with call bell/phone within reach;with chair alarm set Nurse Communication: Mobility status   GP     Konrad Penta 10/20/2012, 10:58 AM

## 2012-10-21 ENCOUNTER — Inpatient Hospital Stay
Admission: RE | Admit: 2012-10-21 | Discharge: 2013-01-05 | Disposition: A | Discharge status: Home / Self Care | Payer: Medicare PPO | Source: Ambulatory Visit | Attending: Internal Medicine | Admitting: Internal Medicine

## 2012-10-21 DIAGNOSIS — I10 Essential (primary) hypertension: Secondary | ICD-10-CM

## 2012-10-21 DIAGNOSIS — E870 Hyperosmolality and hypernatremia: Secondary | ICD-10-CM

## 2012-10-21 DIAGNOSIS — T148XXA Other injury of unspecified body region, initial encounter: Secondary | ICD-10-CM

## 2012-10-21 DIAGNOSIS — S72019A Unspecified intracapsular fracture of unspecified femur, initial encounter for closed fracture: Principal | ICD-10-CM

## 2012-10-21 LAB — CBC WITH DIFFERENTIAL/PLATELET
Eosinophils Absolute: 0.1 10*3/uL (ref 0.0–0.7)
Eosinophils Relative: 1 % (ref 0–5)
Hemoglobin: 11.1 g/dL — ABNORMAL LOW (ref 13.0–17.0)
Lymphocytes Relative: 16 % (ref 12–46)
MCH: 29.1 pg (ref 26.0–34.0)
Monocytes Absolute: 1.7 10*3/uL — ABNORMAL HIGH (ref 0.1–1.0)
Neutrophils Relative %: 69 % (ref 43–77)
Platelets: 230 10*3/uL (ref 150–400)
RBC: 3.82 MIL/uL — ABNORMAL LOW (ref 4.22–5.81)

## 2012-10-21 LAB — BASIC METABOLIC PANEL
Calcium: 8.6 mg/dL (ref 8.4–10.5)
Creatinine, Ser: 1.03 mg/dL (ref 0.50–1.35)
GFR calc non Af Amer: 60 mL/min — ABNORMAL LOW (ref >90)
Sodium: 140 mEq/L (ref 135–145)

## 2012-10-21 MED ORDER — ENOXAPARIN SODIUM 30 MG/0.3ML ~~LOC~~ SOLN: 30.0000 mg | SUBCUTANEOUS | Status: DC

## 2012-10-21 MED ORDER — HYDROCODONE-ACETAMINOPHEN 5-325 MG PO TABS
1.0000 | ORAL_TABLET | ORAL | Status: DC | PRN
Start: 2012-10-21 — End: 2015-01-15

## 2012-10-21 MED ORDER — DEXTROSE 5 % IV SOLN: 1.0000 g | INTRAVENOUS | Status: DC

## 2012-10-21 MED ORDER — METOPROLOL TARTRATE 50 MG PO TABS: 25.0000 mg | ORAL_TABLET | Freq: Two times a day (BID) | ORAL | Status: DC

## 2012-10-21 NOTE — Discharge Summary (Signed)
Physician Discharge Summary  Jerry [name] JWJ:[id number] DOB: 10-23-1916 DOA: 10/17/2012  PCP: No primary provider on file.  Admit date: 10/17/2012 Discharge date: 10/21/2012  Time spent: 35 minutes  Recommendations for Outpatient Follow-up:  1. Discharge to skilled nursing facility 2. Follow up with Dr. Hilda Lias in one month 3. Remove staples on 12/28 and replace with steri-strips 4. Left hip toe touch only.  If unable to do because of confusion, then will need bed to chair to bed transfers  Discharge Diagnoses:  Principal Problem:  *Hip fracture, left Active Problems:  UTI (lower urinary tract infection) E. Coli, present on admission  Hypokalemia  Leukocytosis  Hypertension  Hypernatremia   Discharge Condition: improved  Diet recommendation: low salt  Filed Weights   10/17/12 1617  Weight: 47.6 kg (104 lb 15 oz)    History of present illness:  Jerry [name] is a 78 y.o. male with no known medical problems and is not on any chronic medications, who is brought to the emergency room after suffering a fall. His family reports that he has been falling at home for the past few weeks. He denies loss of consciousness or head trauma. He reports stumbling over his feet and falling. He denies any dizziness. He's not had any fever or cough. No history of confusion. His family does report increased urinary frequency and urgency. No nausea or vomiting. By mouth intake has been somewhat declined over the past few days. He is otherwise pretty functional, ambulates independently other than the past few weeks. He was evaluated in the emergency room was found to have a left-sided hip fracture. He'll be admitted for further treatment   Hospital Course:  This gentleman was admitted after a mechanical fall. He was found to have a left hip fracture. He underwent operative repair with Dr. Hilda Lias. His postoperative course has remained unremarkable. He'll continue on Lovenox for DVT  prophylaxis for the next month and follow up with Dr. Hilda Lias in one month.  Patient did have a positive urine culture for Escherichia coli. He was started on Rocephin since admission. Escherichia coli sensitivities showed resistance to multiple oral antibiotics. For this reason a PICC line was placed and patient will complete a course of Rocephin as an outpatient.  Patient was noted to be somewhat hypertensive in the hospital. This could certainly be affected by pain. For now we will start him on metoprolol and this can be further adjusted as an outpatient.  Patient will be sent to a skilled nursing facility today for continued physical therapy.  Procedures: In situ pinning with cannulated hip screws, Asnis type of  the left hip using screws measuring 90 mm, 90 mm, and 85 mm long.   Consultations:  Orthopedics. Dr. Hilda Lias   Discharge Exam: Filed Vitals:   10/20/12 0500 10/20/12 1330 10/20/12 2006 10/21/12 0626  BP: 149/84 186/90 148/71 161/84  Pulse: 65 98 87 97  Temp: 97.1 F (36.2 C) 99.2 F (37.3 C) 97.6 F (36.4 C) 97.8 F (36.6 C)  TempSrc: Oral Oral Oral Oral  Resp:  18 18 18   Height:      Weight:      SpO2: 100% 97% 99% 98%    General: NAD Cardiovascular: s1, s2, rrr Respiratory: cta b  Discharge Instructions  Discharge Orders    Future Orders Please Complete By Expires   Diet - low sodium heart healthy      Increase activity slowly          Medication List  As of 10/21/2012 10:00 AM    TAKE these medications         dextrose 5 % SOLN 50 mL with cefTRIAXone 1 G SOLR 1 g   Inject 1 g into the vein daily. Until 12/25      enoxaparin 30 MG/0.3ML injection   Commonly known as: LOVENOX   Inject 0.3 mLs (30 mg total) into the skin daily. Until 1/16      HYDROcodone-acetaminophen 5-325 MG per tablet   Commonly known as: NORCO/VICODIN   Take 1-2 tablets by mouth every 4 (four) hours as needed.      metoprolol 50 MG tablet   Commonly known as:  LOPRESSOR   Take 0.5 tablets (25 mg total) by mouth 2 (two) times daily.          The results of significant diagnostics from this hospitalization (including imaging, microbiology, ancillary and laboratory) are listed below for reference.    Significant Diagnostic Studies: Dg Chest 1 View  10/17/2012  *RADIOLOGY REPORT*  Clinical Data: Left hip fracture post fall  CHEST - 1 VIEW  Comparison: None  Findings: Skin folds project over the chest bilaterally. Normal heart size, mediastinal contours, and pulmonary vascularity. Atherosclerotic calcification aorta. Lungs clear. Question underlying emphysematous changes. No pleural effusion or pneumothorax. Bones appear demineralized.  IMPRESSION: Question emphysematous changes. No acute abnormalities.   Original Report Authenticated By: Ulyses Southward, M.D.    Dg Hip Complete Left  10/17/2012  *RADIOLOGY REPORT*  Clinical Data: Left hip pain post fall  LEFT HIP - COMPLETE 2+ VIEW  Comparison: None  Findings: Osseous demineralization. Hip joint spaces preserved. Subcapital left femoral neck fracture, not significantly displaced. No dislocation identified. Pelvis appears intact. Degenerative disc and facet disease changes lumbar spine. Scattered vascular calcifications.  IMPRESSION: Subcapital fracture left femoral neck.   Original Report Authenticated By: Ulyses Southward, M.D.    Dg Hip Operative Left  10/18/2012  *RADIOLOGY REPORT*  Clinical Data: ORIF subcapital left femoral neck fracture.  OPERATIVE LEFT HIP 2 VIEWS 10/18/2012:  Comparison: Left hip x-rays yesterday.  Findings: 3 spot images from the C-arm fluoroscopic device, AP and lateral views of the left hip, were submitted for interpretation post-operatively.  3 cannulated screws have been placed across the subcapital left femoral neck fracture.  Alignment appears anatomic.  IMPRESSION: ORIF subcapital left femoral neck fracture.   Original Report Authenticated By: Hulan Saas, M.D.    Dg Chest Port  1 View  10/20/2012  *RADIOLOGY REPORT*  Clinical Data: Line placement  PORTABLE CHEST - 1 VIEW  Comparison: Portable exam 1434 hours compared to 10/17/2012  Findings: Severely rotated to the left. Right arm PICC line, tip projecting over SVC. Enlargement of cardiac silhouette. Atherosclerotic calcification aorta. Skin folds project over left chest. Question mild elevation of left diaphragm. Emphysematous changes suspected without gross infiltrate or effusion. No pneumothorax or acute osseous findings.  IMPRESSION: Tip of right arm PICC line projects over SVC. Enlargement of cardiac silhouette. Emphysematous changes suspected.   Original Report Authenticated By: Ulyses Southward, M.D.     Microbiology: Recent Results (from the past 240 hour(s))  URINE CULTURE     Status: Normal   Collection Time   10/17/12  2:26 PM      Component Value Range Status Comment   Specimen Description URINE, CATHETERIZED   Final    Special Requests NONE   Final    Culture  Setup Time 10/17/2012 17:50   Final    Colony Count >=100,000 COLONIES/ML  Final    Culture ESCHERICHIA COLI   Final    Report Status 10/19/2012 FINAL   Final    Organism ID, Bacteria ESCHERICHIA COLI   Final   SURGICAL PCR SCREEN     Status: Normal   Collection Time   10/18/12 12:01 AM      Component Value Range Status Comment   MRSA, PCR NEGATIVE  NEGATIVE Final    Staphylococcus aureus NEGATIVE  NEGATIVE Final      Labs: Basic Metabolic Panel:  Lab 10/21/12 4098 10/20/12 0513 10/19/12 0510 10/18/12 0505 10/17/12 1326  NA 140 148* 146* 145 145  K 3.6 4.6 4.0 3.5 3.4*  CL 108 117* 115* 112 109  CO2 25 27 20 27 28   GLUCOSE 108* 108* 84 91 92  BUN 9 12 12 12 13   CREATININE 1.03 1.15 0.94 1.08 1.10  CALCIUM 8.6 9.1 8.5 9.0 10.0  MG -- -- -- -- --  PHOS -- -- -- -- --   Liver Function Tests: No results found for this basename: AST:5,ALT:5,ALKPHOS:5,BILITOT:5,PROT:5,ALBUMIN:5 in the last 168 hours No results found for this basename:  LIPASE:5,AMYLASE:5 in the last 168 hours No results found for this basename: AMMONIA:5 in the last 168 hours CBC:  Lab 10/21/12 0520 10/20/12 0513 10/19/12 0510 10/18/12 0505 10/17/12 1326  WBC 12.1* 12.8* 11.9* 11.9* 15.6*  NEUTROABS 8.4* 8.7* 9.1* -- 11.6*  HGB 11.1* 11.0* 11.7* 11.7* 13.5  HCT 31.9* 32.4* 34.0* 33.8* 39.0  MCV 83.5 83.7 84.4 83.7 83.2  PLT 230 241 207 170 178   Cardiac Enzymes: No results found for this basename: CKTOTAL:5,CKMB:5,CKMBINDEX:5,TROPONINI:5 in the last 168 hours BNP: BNP (last 3 results) No results found for this basename: PROBNP:3 in the last 8760 hours CBG: No results found for this basename: GLUCAP:5 in the last 168 hours     Signed:  MEMON,JEHANZEB  [facility] 10/21/2012, 10:00 AM

## 2012-10-21 NOTE — Progress Notes (Signed)
Subjective: 3 Days Post-Op Procedure(s) (LRB): CANNULATED HIP PINNING (Left) Patient reports pain as 2 on 0-10 scale.    Objective: Vital signs in last 24 hours: Temp:  [97.6 F (36.4 C)-99.2 F (37.3 C)] 97.8 F (36.6 C) (12/21 0626) Pulse Rate:  [87-98] 97  (12/21 0626) Resp:  [18] 18  (12/21 0626) BP: (148-186)/(71-90) 161/84 mmHg (12/21 0626) SpO2:  [97 %-99 %] 98 % (12/21 0626)  Intake/Output from previous day: 12/20 0701 - 12/21 0700 In: 685 [P.O.:480; I.V.:205] Out: -  Intake/Output this shift:     Basename 10/21/12 0520 10/20/12 0513 10/19/12 0510  HGB 11.1* 11.0* 11.7*    Basename 10/21/12 0520 10/20/12 0513  WBC 12.1* 12.8*  RBC 3.82* 3.87*  HCT 31.9* 32.4*  PLT 230 241    Basename 10/21/12 0520 10/20/12 0513  NA 140 148*  K 3.6 4.6  CL 108 117*  CO2 25 27  BUN 9 12  CREATININE 1.03 1.15  GLUCOSE 108* 108*  CALCIUM 8.6 9.1   No results found for this basename: LABPT:2,INR:2 in the last 72 hours  Neurovascular intact Sensation intact distally Intact pulses distally Dorsiflexion/Plantar flexion intact Incision: no drainage  Assessment/Plan: 3 Days Post-Op Procedure(s) (LRB): CANNULATED HIP PINNING (Left) Discharge to SNF when bed available.  Jerry [name] 10/21/2012, 7:59 AM

## 2012-10-23 MED ORDER — HYDROGEN PEROXIDE 3 % EX SOLN
CUTANEOUS | Status: AC | PRN
  Administered 2012-10-23: 1

## 2012-11-23 ENCOUNTER — Ambulatory Visit (HOSPITAL_COMMUNITY)
Admit: 2012-11-23 | Discharge: 2012-11-23 | Disposition: A | Discharge status: Home / Self Care | Payer: Medicare PPO | Attending: Orthopaedic Surgery | Admitting: Orthopaedic Surgery

## 2012-11-23 ENCOUNTER — Ambulatory Visit (HOSPITAL_COMMUNITY): Payer: Medicare PPO

## 2012-11-23 DIAGNOSIS — S72033A Displaced midcervical fracture of unspecified femur, initial encounter for closed fracture: Secondary | ICD-10-CM | POA: Insufficient documentation

## 2012-11-23 DIAGNOSIS — X58XXXA Exposure to other specified factors, initial encounter: Secondary | ICD-10-CM | POA: Insufficient documentation

## 2013-01-03 ENCOUNTER — Ambulatory Visit (HOSPITAL_COMMUNITY)
Admit: 2013-01-03 | Discharge: 2013-01-03 | Disposition: A | Discharge status: Home / Self Care | Payer: Medicare PPO | Attending: Internal Medicine | Admitting: Internal Medicine

## 2013-01-03 DIAGNOSIS — X58XXXA Exposure to other specified factors, initial encounter: Secondary | ICD-10-CM | POA: Insufficient documentation

## 2013-01-03 DIAGNOSIS — S72009A Fracture of unspecified part of neck of unspecified femur, initial encounter for closed fracture: Secondary | ICD-10-CM | POA: Insufficient documentation

## 2014-12-31 DIAGNOSIS — R400 Somnolence: Secondary | ICD-10-CM

## 2014-12-31 HISTORY — DX: Somnolence: R40.0

## 2015-01-13 ENCOUNTER — Emergency Department (HOSPITAL_COMMUNITY): Payer: Commercial Managed Care - HMO

## 2015-01-13 ENCOUNTER — Inpatient Hospital Stay (HOSPITAL_COMMUNITY): Payer: Commercial Managed Care - HMO

## 2015-01-13 ENCOUNTER — Inpatient Hospital Stay (HOSPITAL_COMMUNITY)
Admission: EM | Admit: 2015-01-13 | Discharge: 2015-01-15 | DRG: 640 | Disposition: A | Discharge status: Home / Self Care | Payer: Commercial Managed Care - HMO | Attending: Internal Medicine | Admitting: Internal Medicine

## 2015-01-13 ENCOUNTER — Encounter (HOSPITAL_COMMUNITY): Payer: Self-pay | Admitting: Emergency Medicine

## 2015-01-13 DIAGNOSIS — Z8546 Personal history of malignant neoplasm of prostate: Secondary | ICD-10-CM | POA: Diagnosis not present

## 2015-01-13 DIAGNOSIS — R630 Anorexia: Secondary | ICD-10-CM | POA: Diagnosis present

## 2015-01-13 DIAGNOSIS — E860 Dehydration: Secondary | ICD-10-CM | POA: Diagnosis present

## 2015-01-13 DIAGNOSIS — I10 Essential (primary) hypertension: Secondary | ICD-10-CM | POA: Diagnosis present

## 2015-01-13 DIAGNOSIS — F17210 Nicotine dependence, cigarettes, uncomplicated: Secondary | ICD-10-CM | POA: Diagnosis present

## 2015-01-13 DIAGNOSIS — R627 Adult failure to thrive: Secondary | ICD-10-CM | POA: Diagnosis present

## 2015-01-13 DIAGNOSIS — E43 Unspecified severe protein-calorie malnutrition: Secondary | ICD-10-CM | POA: Diagnosis present

## 2015-01-13 DIAGNOSIS — R634 Abnormal weight loss: Secondary | ICD-10-CM

## 2015-01-13 DIAGNOSIS — Z681 Body mass index (BMI) 19 or less, adult: Secondary | ICD-10-CM | POA: Diagnosis not present

## 2015-01-13 DIAGNOSIS — E870 Hyperosmolality and hypernatremia: Principal | ICD-10-CM | POA: Diagnosis present

## 2015-01-13 DIAGNOSIS — Z79899 Other long term (current) drug therapy: Secondary | ICD-10-CM | POA: Diagnosis not present

## 2015-01-13 DIAGNOSIS — K5900 Constipation, unspecified: Secondary | ICD-10-CM | POA: Diagnosis present

## 2015-01-13 DIAGNOSIS — IMO0001 Reserved for inherently not codable concepts without codable children: Secondary | ICD-10-CM

## 2015-01-13 DIAGNOSIS — R109 Unspecified abdominal pain: Secondary | ICD-10-CM | POA: Diagnosis present

## 2015-01-13 HISTORY — DX: Malignant (primary) neoplasm, unspecified: C80.1

## 2015-01-13 HISTORY — DX: Malignant neoplasm of prostate: C61

## 2015-01-13 LAB — I-STAT CHEM 8, ED
BUN: 31 mg/dL — ABNORMAL HIGH (ref 6–23)
CALCIUM ION: 1.33 mmol/L — AB (ref 1.13–1.30)
CHLORIDE: 122 mmol/L — AB (ref 96–112)
Creatinine, Ser: 1.1 mg/dL (ref 0.50–1.35)
GLUCOSE: 137 mg/dL — AB (ref 70–99)
HEMATOCRIT: 45 % (ref 39.0–52.0)
HEMOGLOBIN: 15.3 g/dL (ref 13.0–17.0)
Potassium: 3.9 mmol/L (ref 3.5–5.1)
Sodium: 160 mmol/L — ABNORMAL HIGH (ref 135–145)
TCO2: 20 mmol/L (ref 0–100)

## 2015-01-13 LAB — COMPREHENSIVE METABOLIC PANEL
ALT: 32 U/L (ref 0–53)
ANION GAP: 3 — AB (ref 5–15)
AST: 35 U/L (ref 0–37)
Albumin: 3.3 g/dL — ABNORMAL LOW (ref 3.5–5.2)
Alkaline Phosphatase: 76 U/L (ref 39–117)
BUN: 29 mg/dL — AB (ref 6–23)
CALCIUM: 9.7 mg/dL (ref 8.4–10.5)
CHLORIDE: 127 mmol/L — AB (ref 96–112)
CO2: 28 mmol/L (ref 19–32)
CREATININE: 1.16 mg/dL (ref 0.50–1.35)
GFR, EST AFRICAN AMERICAN: 59 mL/min — AB (ref >90)
GFR, EST NON AFRICAN AMERICAN: 51 mL/min — AB (ref >90)
GLUCOSE: 93 mg/dL (ref 70–99)
POTASSIUM: 4 mmol/L (ref 3.5–5.1)
SODIUM: 158 mmol/L — AB (ref 135–145)
Total Bilirubin: 0.6 mg/dL (ref 0.3–1.2)
Total Protein: 6.2 g/dL (ref 6.0–8.3)

## 2015-01-13 LAB — CBC WITH DIFFERENTIAL/PLATELET
BASOS ABS: 0 10*3/uL (ref 0.0–0.1)
Basophils Relative: 0 % (ref 0–1)
Eosinophils Absolute: 0 10*3/uL (ref 0.0–0.7)
Eosinophils Relative: 0 % (ref 0–5)
HCT: 42.7 % (ref 39.0–52.0)
Hemoglobin: 13.9 g/dL (ref 13.0–17.0)
LYMPHS PCT: 23 % (ref 12–46)
Lymphs Abs: 2.8 10*3/uL (ref 0.7–4.0)
MCH: 29.4 pg (ref 26.0–34.0)
MCHC: 32.6 g/dL (ref 30.0–36.0)
MCV: 90.3 fL (ref 78.0–100.0)
Monocytes Absolute: 1.5 10*3/uL — ABNORMAL HIGH (ref 0.1–1.0)
Monocytes Relative: 12 % (ref 3–12)
NEUTROS ABS: 8.2 10*3/uL — AB (ref 1.7–7.7)
NEUTROS PCT: 65 % (ref 43–77)
PLATELETS: 126 10*3/uL — AB (ref 150–400)
RBC: 4.73 MIL/uL (ref 4.22–5.81)
RDW: 16.2 % — AB (ref 11.5–15.5)
WBC: 12.6 10*3/uL — AB (ref 4.0–10.5)

## 2015-01-13 LAB — LIPASE, BLOOD: LIPASE: 34 U/L (ref 11–59)

## 2015-01-13 LAB — TROPONIN I: Troponin I: 0.03 ng/mL (ref <0.031)

## 2015-01-13 LAB — TSH: TSH: 1.158 u[IU]/mL (ref 0.350–4.500)

## 2015-01-13 MED ORDER — SODIUM CHLORIDE 0.9 % IV BOLUS (SEPSIS)
1000.0000 mL | Freq: Once | INTRAVENOUS | Status: AC
  Administered 2015-01-13: 1000 mL via INTRAVENOUS

## 2015-01-13 MED ORDER — SODIUM CHLORIDE 0.9 % IV SOLN
INTRAVENOUS | Status: DC
  Administered 2015-01-13: 09:00:00 via INTRAVENOUS

## 2015-01-13 MED ORDER — KCL IN DEXTROSE-NACL 20-5-0.45 MEQ/L-%-% IV SOLN
INTRAVENOUS | Status: DC
  Administered 2015-01-13 – 2015-01-14 (×3): via INTRAVENOUS

## 2015-01-13 MED ORDER — BISACODYL 10 MG RE SUPP
10.0000 mg | Freq: Once | RECTAL | Status: AC
  Administered 2015-01-13: 10 mg via RECTAL
  Filled 2015-01-13: qty 1

## 2015-01-13 MED ORDER — ENOXAPARIN SODIUM 40 MG/0.4ML ~~LOC~~ SOLN: 40.0000 mg | SUBCUTANEOUS | Status: DC

## 2015-01-13 MED ORDER — ONDANSETRON HCL 4 MG PO TABS: 4.0000 mg | ORAL_TABLET | Freq: Four times a day (QID) | ORAL | Status: DC | PRN

## 2015-01-13 MED ORDER — ENSURE COMPLETE PO LIQD
237.0000 mL | Freq: Two times a day (BID) | ORAL | Status: DC
  Administered 2015-01-13 – 2015-01-15 (×4): 237 mL via ORAL

## 2015-01-13 MED ORDER — POLYETHYLENE GLYCOL 3350 17 G PO PACK
17.0000 g | PACK | Freq: Every day | ORAL | Status: DC
  Administered 2015-01-13 – 2015-01-15 (×3): 17 g via ORAL
  Filled 2015-01-13 (×3): qty 1

## 2015-01-13 MED ORDER — ONDANSETRON HCL 4 MG/2ML IJ SOLN: 4.0000 mg | Freq: Four times a day (QID) | INTRAMUSCULAR | Status: DC | PRN

## 2015-01-13 MED ORDER — ACETAMINOPHEN 650 MG RE SUPP: 650.0000 mg | Freq: Four times a day (QID) | RECTAL | Status: DC | PRN

## 2015-01-13 MED ORDER — ENOXAPARIN SODIUM 30 MG/0.3ML ~~LOC~~ SOLN
30.0000 mg | SUBCUTANEOUS | Status: DC
  Administered 2015-01-13 – 2015-01-14 (×2): 30 mg via SUBCUTANEOUS
  Filled 2015-01-13 (×3): qty 0.3

## 2015-01-13 MED ORDER — ACETAMINOPHEN 325 MG PO TABS: 650.0000 mg | ORAL_TABLET | Freq: Four times a day (QID) | ORAL | Status: DC | PRN

## 2015-01-13 NOTE — ED Notes (Signed)
Writhing about on bed, but unable to pass stool, pt manually disimpacted for moderate amount of hard stool with pt getting relief. Mineral oil fleets enema done.

## 2015-01-13 NOTE — ED Provider Notes (Signed)
CSN: [id number]     Arrival date & time 01/13/15  0159 History   First MD Initiated Contact with Patient 01/13/15 0208     Chief Complaint  Patient presents with  . Abdominal Pain   Level V caveat for age  (Consider location/radiation/quality/duration/timing/severity/associated sxs/prior Treatment) HPI  Agent brought to the emergency department by his son and his wife. She reports about 3 weeks ago patient started having loss of appetite. He was drinking ensure but even now he doesn't want to drink it. She states he has lost a lot of weight. She states he has gotten weak and he has stopped even going outside to smoke cigarettes. She is unaware fever, coughing, nausea or vomiting. She states he seemed to be having abdominal pain tonight and was grunting and groaning however if you ask him he states he feels fine. She is not sure if he's having any urinary output. She states his last bowel movement was a week ago and that was only a couple small balls. She had put him on Mira lax to have that small bowel movement.  PCP Dr Everette Rank  Past Medical History  Diagnosis Date  . Cancer   . Prostate cancer    Past Surgical History  Procedure Laterality Date  . Hip pinning,cannulated  10/18/2012    Procedure: CANNULATED HIP PINNING;  Surgeon: Sanjuana Kava, MD;  Location: [facility];  Service: Orthopedics;  Laterality: Left;  Asnis Left Hip Pinning    No family history on file. History  Substance Use Topics  . Smoking status: Current Every Day Smoker -- 0.50 packs/day for 76 years    Types: Cigarettes  . Smokeless tobacco: Not on file  . Alcohol Use: Yes     Comment: occasional   lives at home Lives with his son  Review of Systems  All other systems reviewed and are negative.     Allergies  Review of patient's allergies indicates no known allergies.  Home Medications   Prior to Admission medications   Medication Sig Start Date End Date Taking? Authorizing Provider  dextrose 5 % SOLN  50 mL with cefTRIAXone 1 G SOLR 1 g Inject 1 g into the vein daily. Until 12/25 10/21/12   Kathie Dike, MD  enoxaparin (LOVENOX) 30 MG/0.3ML injection Inject 0.3 mLs (30 mg total) into the skin daily. Until 1/16 10/21/12   Kathie Dike, MD  HYDROcodone-acetaminophen (NORCO/VICODIN) 5-325 MG per tablet Take 1-2 tablets by mouth every 4 (four) hours as needed. 10/21/12   Kathie Dike, MD  metoprolol (LOPRESSOR) 50 MG tablet Take 0.5 tablets (25 mg total) by mouth 2 (two) times daily. 10/21/12   Kathie Dike, MD   BP 176/98 mmHg  Pulse 95  Temp(Src) 97.6 F (36.4 C) (Rectal)  Resp 28  SpO2 100%  Vital signs normal except for hypertension   Physical Exam  Constitutional: He is oriented to person, place, and time.  Non-toxic appearance. He does not appear ill. No distress.  Thin frail elderly male  HENT:  Head: Normocephalic and atraumatic.  Right Ear: External ear normal.  Left Ear: External ear normal.  Nose: Nose normal. No mucosal edema or rhinorrhea.  Mouth/Throat: Mucous membranes are normal. No dental abscesses or uvula swelling.  Mucous membranes dry, he is missing all his upper teeth and most of his lower teeth  Eyes: Conjunctivae and EOM are normal. Pupils are equal, round, and reactive to light.  Neck: Normal range of motion and full passive range of motion without pain.  Neck supple.  Cardiovascular: Normal rate, regular rhythm and normal heart sounds.  Exam reveals no gallop and no friction rub.   No murmur heard. Pulmonary/Chest: Effort normal and breath sounds normal. No respiratory distress. He has no wheezes. He has no rhonchi. He has no rales. He exhibits no tenderness and no crepitus.  Abdominal: Soft. Normal appearance and bowel sounds are normal. He exhibits no distension. There is tenderness. There is no rebound and no guarding.  Mild diffuse tenderness of his abdomen  Musculoskeletal: Normal range of motion. He exhibits no edema or tenderness.  Moves all  extremities well.   Neurological: He is alert and oriented to person, place, and time. He has normal strength. No cranial nerve deficit.  Skin: Skin is warm, dry and intact. No rash noted. No erythema. No pallor.  Psychiatric: He has a normal mood and affect. His speech is normal and behavior is normal. His mood appears not anxious.  Nursing note and vitals reviewed.   ED Course  Procedures (including critical care time)  Medications  sodium chloride 0.9 % bolus 1,000 mL (1,000 mLs Intravenous New Bag/Given 01/13/15 0330)  bisacodyl (DULCOLAX) suppository 10 mg (10 mg Rectal Given 01/13/15 0510)    Patient given IV fluids for dehydration noted on physical exam.  After reviewing his x-ray he had a colonic suppository ordered. Nursing staff state they tried to disimpact him however he has a very hard ball in his rectum.  She was noted to try to pass his stool ball with audibly screaming out in pain. The nurse that she was able to start disimpacting him then. He was then given a mineral oil enema. Patient states his abdominal pain is gone and he is feeling improved.  Bladder scan was 160 mL.  Nursing staff unable to obtain blood for blood work. Arterial stick was done by respiratory therapist. They were only able to get enough blood for i-STAT 8.  Discussed his test results with family and patient. Patient needs to be admitted for treatment of his dehydration.  07:13 Dr Roderic Palau, admit to med-surg, Dr Everette Rank attending.    Labs Review Results for orders placed or performed during the hospital encounter of 01/13/15  I-stat chem 8, ed  Result Value Ref Range   Sodium 160 (H) 135 - 145 mmol/L   Potassium 3.9 3.5 - 5.1 mmol/L   Chloride 122 (H) 96 - 112 mmol/L   BUN 31 (H) 6 - 23 mg/dL   Creatinine, Ser 1.10 0.50 - 1.35 mg/dL   Glucose, Bld 137 (H) 70 - 99 mg/dL   Calcium, Ion 1.33 (H) 1.13 - 1.30 mmol/L   TCO2 20 0 - 100 mmol/L   Hemoglobin 15.3 13.0 - 17.0 g/dL   HCT 45.0 39.0 - 52.0  %   Laboratory interpretation all normal except hypernatremia, high chloride, and normal hemoglobin which typically has been around 11, all consistent with dehydration.     Imaging Review Dg Abd Acute W/chest  01/13/2015   CLINICAL DATA:  Periumbilical abdominal pain  EXAM: ACUTE ABDOMEN SERIES (ABDOMEN 2 VIEW & CHEST 1 VIEW)  COMPARISON:  None.  FINDINGS: There is moderate hyperinflation. The lungs are clear except for mild chronic appearing interstitial coarsening. There is a large volume of colonic stool. The abdominal gas pattern is otherwise unremarkable. There is no evidence of obstruction or perforation.  IMPRESSION: Large volume colonic stool. Negative for obstruction or perforation. Pulmonary hyperinflation.   Electronically Signed   By: Valerie Roys.D.  On: 01/13/2015 04:28     EKG Interpretation   Date/Time:  Monday January 13 2015 03:43:26 EDT Ventricular Rate:  94 PR Interval:  147 QRS Duration: 104 QT Interval:  381 QTC Calculation: 476 R Axis:   -56 Text Interpretation:  Sinus rhythm LAD, consider left anterior fascicular  block Abnormal R-wave progression, early transition LVH with secondary  repolarization abnormality Anterior Q waves, possibly due to LVH Baseline  wander in lead(s) II No significant change since last tracing  17 Oct 2012  Confirmed by Camie Hauss  MD-I, Tonjia Parillo ([id number]) on 01/13/2015 3:58:14 AM      MDM   Final diagnoses:  Constipation, unspecified constipation type  Loss of appetite  Hypernatremia  Chloride, increased level  Dehydration    Plan admission  Rolland Porter, MD, Barbette Or, MD 01/13/15 [phone]

## 2015-01-13 NOTE — ED Notes (Signed)
Arterial draw done and just enough blood was obtained for I-stats, which were done

## 2015-01-13 NOTE — ED Notes (Signed)
Dulcolax suppos given, large amt very hard stool noted in rectum.

## 2015-01-13 NOTE — ED Notes (Signed)
Much calmer, laying quietly on his side, states he has no pain now

## 2015-01-13 NOTE — ED Notes (Signed)
Per pt's family pt has been "running to the bathroom grabbing his stomach" Has not been able to use bathroom but has had decreased appetite for the past 2 weeks. Pt rolling in bed.

## 2015-01-13 NOTE — Progress Notes (Addendum)
INITIAL NUTRITION ASSESSMENT  DOCUMENTATION CODES Per approved criteria  -Severe malnutrition in the context of acute illness   Pt meets criteria for severe MALNUTRITION in the context of acute illness as evidenced by moderate-severe muscle wasting and fat loss (clavicles, patellar, acromion and orbital, upper arm regions) .   INTERVENTION: -Mechanical soft diet add gravy to protein and potatoes  -Ensure Enlive po BID, each supplement provides 350 kcal and 20 grams of protein   -Encourage meal and supplement intake and provide assistance with feeding as needed  NUTRITION DIAGNOSIS: Malnutirtion related to acute illness as evidenced by modrate to severe muscle wasting and  fat mass depletion.      Goal: Pt to meet >/= 90% of their estimated nutrition needs    Monitor:  Po intake (meals and supplements), labs and wt trends   Reason for Assessment: poor po intake  81 y.o. male   ASSESSMENT: Dehydration on admission. Pt presents with decreased appetite (3 weeks) and abdominal pain on admission. Pt found to be constipated. No family present at this time. He is very pleasant gentleman who is HOH. His lunch tray is here and untouched. He says he'll eat something later. He has several missing and broken teeth.  Nursing reports very poor intake this morning at breakfast.  Pt says he likes Ensure and is agreeable to drinking them while he's here. Will also downgrade his diet to mechanical soft to decrease the labor of eating.  Nutrition Focused Physical Exam:  Subcutaneous Fat:  Orbital Region: moderate-severe depletion  Upper Arm Region: severe depletion Thoracic and Lumbar Region: severe depletion   Muscle:  Temple Region: mild wasting Clavicle Bone Region: moderate-severe wasting Clavicle and Acromion Bone Region: moderate wasting Scapular Bone Region: severe wasting Dorsal Hand: moderate wasting Patellar Region: severe wasting Anterior Thigh Region: severe wasting Posterior  Calf Region: moderate wasting  Edema: none noted    Height: Ht Readings from Last 1 Encounters:  01/13/15 5\' 7"  (1.702 m)    Weight: Wt Readings from Last 1 Encounters:  10/17/12 104 lb 15 oz (47.6 kg)  (01/13/15)  Wt 98.11#  Ideal Body Weight: 148#  (67 kg)  % Ideal Body Weight: 71%  Wt Readings from Last 10 Encounters:  10/17/12 104 lb 15 oz (47.6 kg)  08/04/12 125 lb (56.7 kg)  08/01/12 125 lb (56.7 kg)    Usual Body Weight: 125#  % Usual Body Weight: 84%  Estimated Nutritional Needs: Kcal: 1680-1820 Protein: 55-65 gr Fluid: 1500 ml daily (normal needs)  Skin: no issues   Diet Order: Diet regular  EDUCATION NEEDS: -Education not appropriate at this time   Intake/Output Summary (Last 24 hours) at 01/13/15 1220 Last data filed at 01/13/15 0956  Gross per 24 hour  Intake      0 ml  Output    200 ml  Net   -200 ml    Last BM:  01/13/15  Labs:   Recent Labs Lab 01/13/15 0628 01/13/15 1106  NA 160* 158*  K 3.9 4.0  CL 122* 127*  CO2  --  28  BUN 31* 29*  CREATININE 1.10 1.16  CALCIUM  --  9.7  GLUCOSE 137* 93    CBG (last 3)  No results for input(s): GLUCAP in the last 72 hours.  Scheduled Meds: . enoxaparin (LOVENOX) injection  30 mg Subcutaneous Q24H  . polyethylene glycol  17 g Oral Daily    Continuous Infusions: . dextrose 5 % and 0.45 % NaCl with KCl  20 mEq/L      Past Medical History  Diagnosis Date  . Cancer   . Prostate cancer     Past Surgical History  Procedure Laterality Date  . Hip pinning,cannulated  10/18/2012    Procedure: CANNULATED HIP PINNING;  Surgeon: Sanjuana Kava, MD;  Location: [facility];  Service: Orthopedics;  Laterality: Left;  Asnis Left Hip Pinning    Colman Cater MS,RD,CSG,LDN Office: [phone] Pager: [phone]

## 2015-01-13 NOTE — ED Notes (Addendum)
Attempted IV X2 without success.

## 2015-01-13 NOTE — H&P (Signed)
[facility] History and Physical  Jerry [name] NKN:[id number] DOB: 1916-08-31 DOA: 01/13/2015  Referring physician: Dr. Eliane Decree, ER PCP: Cyndee Brightly, MD   Chief Complaint: abdominal pain  HPI: Jerry [name] is a 81 y.o. male who was brought to the emergency room by his family for concerns of decreased appetite, abdominal pain. Patient's daughter-in-law reports that he had stopped eating and drinking approximately 3 weeks ago. He did not have any fever, cough, shortness of breath, dysuria. The family also noticed that he significantly started losing weight. They thought this may be related to "old age" and did not pursue any evaluation with his primary care physician. Last night, he began to develop significant abdominal pain. It was noted that he had developed constipation. He was evaluated in the emergency room and noted to have significant constipation. After disimpaction performed by ER nurses, the patient had a bowel movement and felt significantly improved. Blood work indicated that he had an elevated sodium of 160. He was noted to be clinically dehydrated. He was referred for admission.   Review of Systems:  Pertinent positives as per HPI, otherwise negative  Past Medical History  Diagnosis Date  . Cancer   . Prostate cancer    Past Surgical History  Procedure Laterality Date  . Hip pinning,cannulated  10/18/2012    Procedure: CANNULATED HIP PINNING;  Surgeon: Sanjuana Kava, MD;  Location: [facility];  Service: Orthopedics;  Laterality: Left;  Asnis Left Hip Pinning    Social History:  reports that he has been smoking Cigarettes.  He has a 38 pack-year smoking history. He does not have any smokeless tobacco history on file. He reports that he drinks alcohol. He reports that he does not use illicit drugs.  No Known Allergies  Family history: discussed with patient and his family. They're unaware of any medical problems in the family. The patient's mother lived  into her 12s. They're unaware as to the cause of her death.  Prior to Admission medications   Medication Sig Start Date End Date Taking? Authorizing Provider  dextrose 5 % SOLN 50 mL with cefTRIAXone 1 G SOLR 1 g Inject 1 g into the vein daily. Until 12/25 Patient not taking: Reported on 01/13/2015 10/21/12   Kathie Dike, MD  enoxaparin (LOVENOX) 30 MG/0.3ML injection Inject 0.3 mLs (30 mg total) into the skin daily. Until 1/16 Patient not taking: Reported on 01/13/2015 10/21/12   Kathie Dike, MD  HYDROcodone-acetaminophen (NORCO/VICODIN) 5-325 MG per tablet Take 1-2 tablets by mouth every 4 (four) hours as needed. Patient not taking: Reported on 01/13/2015 10/21/12   Kathie Dike, MD  metoprolol (LOPRESSOR) 50 MG tablet Take 0.5 tablets (25 mg total) by mouth 2 (two) times daily. Patient not taking: Reported on 01/13/2015 10/21/12   Kathie Dike, MD   Physical Exam: Filed Vitals:   01/13/15 0400 01/13/15 0601 01/13/15 0756 01/13/15 1038  BP:  162/100 139/82 144/91  Pulse:  95 90 70  Temp:   98.7 F (37.1 C) 98.3 F (36.8 C)  TempSrc:   Oral Oral  Resp: 28 16 20 20   Height:    5\' 7"  (1.702 m)  SpO2:  100% 98% 99%    Wt Readings from Last 3 Encounters:  10/17/12 47.6 kg (104 lb 15 oz)  08/04/12 56.7 kg (125 lb)  08/01/12 56.7 kg (125 lb)    General:  Appears calm and comfortable Eyes: PERRL, normal lids, irises & conjunctiva ENT: mucous membranes are dry Neck: no LAD, masses  or thyromegaly Cardiovascular: RRR, no m/r/g. No LE edema. Telemetry: SR, no arrhythmias  Respiratory: CTA bilaterally, no w/r/r. Normal respiratory effort. Abdomen: soft, ntnd Skin: no rash or induration seen on limited exam Musculoskeletal: grossly normal tone BUE/BLE Psychiatric: grossly normal mood and affect, speech fluent and appropriate Neurologic: grossly non-focal.          Labs on Admission:  Basic Metabolic Panel:  Recent Labs Lab 01/13/15 0628  NA 160*  K 3.9  CL 122*    GLUCOSE 137*  BUN 31*  CREATININE 1.10   Liver Function Tests: No results for input(s): AST, ALT, ALKPHOS, BILITOT, PROT, ALBUMIN in the last 168 hours. No results for input(s): LIPASE, AMYLASE in the last 168 hours. No results for input(s): AMMONIA in the last 168 hours. CBC:  Recent Labs Lab 01/13/15 0628  HGB 15.3  HCT 45.0   Cardiac Enzymes: No results for input(s): CKTOTAL, CKMB, CKMBINDEX, TROPONINI in the last 168 hours.  BNP (last 3 results) No results for input(s): BNP in the last 8760 hours.  ProBNP (last 3 results) No results for input(s): PROBNP in the last 8760 hours.  CBG: No results for input(s): GLUCAP in the last 168 hours.  Radiological Exams on Admission: Dg Abd Acute W/chest  01/13/2015   CLINICAL DATA:  Periumbilical abdominal pain  EXAM: ACUTE ABDOMEN SERIES (ABDOMEN 2 VIEW & CHEST 1 VIEW)  COMPARISON:  None.  FINDINGS: There is moderate hyperinflation. The lungs are clear except for mild chronic appearing interstitial coarsening. There is a large volume of colonic stool. The abdominal gas pattern is otherwise unremarkable. There is no evidence of obstruction or perforation.  IMPRESSION: Large volume colonic stool. Negative for obstruction or perforation. Pulmonary hyperinflation.   Electronically Signed   By: Andreas Newport M.D.   On: 01/13/2015 04:28    EKG: Independently reviewed. No change from prior EKG  Assessment/Plan Active Problems:   Hypertension   Hypernatremia   Loss of appetite   Dehydration   CN (constipation)   Abdominal pain   1. Hypernatremia. Clinically to dehydration. We'll start the patient on hypotonic fluids. Continue to follow 2. Dehydration. Continue IV fluids 3. Abdominal pain related to constipation. Resolved after disimpaction. Start on daily Miralax. 4. Loss of appetite/failure to thrive. Check urinalysis, chest x-ray. Request nutrition input.  Code Status: full code DVT Prophylaxis: lovenox Family  Communication: discussed with daughter in law over the phone Disposition Plan: pending hospital course, may need placement  Time spent: 68mins  MEMON,JEHANZEB [facility] Pager [phone]

## 2015-01-13 NOTE — ED Notes (Signed)
Still unable to draw labs, venipunctures are giving flash backs but veins collapsing. ERMD aware and respiratory called for arterial draw.

## 2015-01-14 LAB — BASIC METABOLIC PANEL
ANION GAP: 5 (ref 5–15)
BUN: 21 mg/dL (ref 6–23)
CO2: 27 mmol/L (ref 19–32)
Calcium: 9.1 mg/dL (ref 8.4–10.5)
Chloride: 123 mmol/L — ABNORMAL HIGH (ref 96–112)
Creatinine, Ser: 1.01 mg/dL (ref 0.50–1.35)
GFR, EST AFRICAN AMERICAN: 70 mL/min — AB (ref >90)
GFR, EST NON AFRICAN AMERICAN: 61 mL/min — AB (ref >90)
Glucose, Bld: 100 mg/dL — ABNORMAL HIGH (ref 70–99)
Potassium: 4.3 mmol/L (ref 3.5–5.1)
SODIUM: 155 mmol/L — AB (ref 135–145)

## 2015-01-14 LAB — CBC
HEMATOCRIT: 40 % (ref 39.0–52.0)
Hemoglobin: 13.1 g/dL (ref 13.0–17.0)
MCH: 29.7 pg (ref 26.0–34.0)
MCHC: 32.8 g/dL (ref 30.0–36.0)
MCV: 90.7 fL (ref 78.0–100.0)
Platelets: 116 10*3/uL — ABNORMAL LOW (ref 150–400)
RBC: 4.41 MIL/uL (ref 4.22–5.81)
RDW: 16.2 % — ABNORMAL HIGH (ref 11.5–15.5)
WBC: 8.5 10*3/uL (ref 4.0–10.5)

## 2015-01-14 LAB — POCT I-STAT TROPONIN I: Troponin i, poc: 0.01 ng/mL (ref 0.00–0.08)

## 2015-01-14 NOTE — Progress Notes (Signed)
[facility] PROGRESS NOTE  DAYTONA RETANA WJX:[id number] DOB: Nov 12, 1915 DOA: 01/13/2015 PCP: Cyndee Brightly, MD  Assessment/Plan: 1. Hypernatremia. Related to dehydration and poor by mouth intake. Improving with hypotonic fluids. Continue current treatments and follow. Encouraged oral intake 2. Dehydration. Improving with IV fluids. 3. Abdominal pain related to constipation. Abdominal x-ray showed large stool burden. Continue on daily MiraLAX. 4. Failure to thrive. TSH normal. Chest x-ray did not show pneumonia. Urinalysis is in order. Nutrition consult.  Code Status: full code Family Communication: no family present Disposition Plan: discharge home once improved   Consultants:    Procedures:    Antibiotics:    HPI/Subjective: Denies any complaints  Objective: Filed Vitals:   01/14/15 0644  BP: 126/70  Pulse: 76  Temp: 98.6 F (37 C)  Resp: 20    Intake/Output Summary (Last 24 hours) at 01/14/15 1232 Last data filed at 01/13/15 1900  Gross per 24 hour  Intake 573.33 ml  Output    150 ml  Net 423.33 ml   Filed Weights   01/13/15 1038  Weight: 44.77 kg (98 lb 11.2 oz)    Exam:   General:  NAD  Cardiovascular: s1, s2, rrr  Respiratory: cta b  Abdomen: soft, nt, bd+  Musculoskeletal: no edema b/l   Data Reviewed: Basic Metabolic Panel:  Recent Labs Lab 01/13/15 0628 01/13/15 1106 01/14/15 0612  NA 160* 158* 155*  K 3.9 4.0 4.3  CL 122* 127* 123*  CO2  --  28 27  GLUCOSE 137* 93 100*  BUN 31* 29* 21  CREATININE 1.10 1.16 1.01  CALCIUM  --  9.7 9.1   Liver Function Tests:  Recent Labs Lab 01/13/15 1106  AST 35  ALT 32  ALKPHOS 76  BILITOT 0.6  PROT 6.2  ALBUMIN 3.3*    Recent Labs Lab 01/13/15 1106  LIPASE 34   No results for input(s): AMMONIA in the last 168 hours. CBC:  Recent Labs Lab 01/13/15 0628 01/13/15 1106 01/14/15 0612  WBC  --  12.6* 8.5  NEUTROABS  --  8.2*  --   HGB 15.3 13.9 13.1   HCT 45.0 42.7 40.0  MCV  --  90.3 90.7  PLT  --  126* 116*   Cardiac Enzymes:  Recent Labs Lab 01/13/15 1106  TROPONINI 0.03   BNP (last 3 results) No results for input(s): BNP in the last 8760 hours.  ProBNP (last 3 results) No results for input(s): PROBNP in the last 8760 hours.  CBG: No results for input(s): GLUCAP in the last 168 hours.  No results found for this or any previous visit (from the past 240 hour(s)).   Studies: Dg Abd Acute W/chest  01/13/2015   CLINICAL DATA:  Periumbilical abdominal pain  EXAM: ACUTE ABDOMEN SERIES (ABDOMEN 2 VIEW & CHEST 1 VIEW)  COMPARISON:  None.  FINDINGS: There is moderate hyperinflation. The lungs are clear except for mild chronic appearing interstitial coarsening. There is a large volume of colonic stool. The abdominal gas pattern is otherwise unremarkable. There is no evidence of obstruction or perforation.  IMPRESSION: Large volume colonic stool. Negative for obstruction or perforation. Pulmonary hyperinflation.   Electronically Signed   By: Andreas Newport M.D.   On: 01/13/2015 04:28    Scheduled Meds: . enoxaparin (LOVENOX) injection  30 mg Subcutaneous Q24H  . feeding supplement (ENSURE COMPLETE)  237 mL Oral BID BM  . polyethylene glycol  17 g Oral Daily   Continuous Infusions: . dextrose 5 %  and 0.45 % NaCl with KCl 20 mEq/L 100 mL/hr at 01/14/15 0901    Active Problems:   Hypertension   Hypernatremia   Loss of appetite   Dehydration   CN (constipation)   Abdominal pain    Time spent: 28mins    Hortense Cantrall  [facility] Pager [phone]. If 7PM-7AM, please contact night-coverage at [email or web address], password [facility] 01/14/2015, 12:32 PM  LOS: 1 day

## 2015-01-14 NOTE — Care Management Note (Signed)
    Page 1 of 1   01/14/2015     2:53:17 PM CARE MANAGEMENT NOTE 01/14/2015  Patient:  Jerry [name], Jerry [name]   Account Number:  [id number]  Date Initiated:  01/14/2015  Documentation initiated by:  Jolene Provost  Subjective/Objective Assessment:   Pt is from home, lives with son and daughter in law. Pt is confused and information was derived from pt's daughter in law. Pt uses walker and has wheelchair but doesn't use often. Pt has no other DME or [place] services.     Action/Plan:   Pt is able to feed himself and walk short distances but requires assistance with bathing and dressing. Son and DIL are retired and home with pt 24/7. Pt plans to return home at discharge. No CM needs identified.   Anticipated DC Date:  01/15/2015   Anticipated DC Plan:  [place]  CM consult      Choice offered to / List presented to:             Status of service:  Completed, signed off Medicare Important Message given?   (If response is "NO", the following Medicare IM given date fields will be blank) Date Medicare IM given:   Medicare IM given by:   Date Additional Medicare IM given:   Additional Medicare IM given by:    Discharge Disposition:  HOME/SELF CARE  Per UR Regulation:  Reviewed for med. necessity/level of care/duration of stay  If discussed at [place] of Stay Meetings, dates discussed:    Comments:  01/14/2015 [place], RN, MSN, CM

## 2015-01-14 NOTE — Evaluation (Signed)
Physical Therapy Evaluation Patient Details Name: Jerry [name] MRN: [id number] DOB: 1915-11-28 Today's Date: 01/14/2015   History of Present Illness  HPI: Jerry [name] is a 81 y.o. male who was brought to the emergency room by his family for concerns of decreased appetite, abdominal pain. Patient's daughter-in-law reports that he had stopped eating and drinking approximately 3 weeks ago. He did not have any fever, cough, shortness of breath, dysuria. The family also noticed that he significantly started losing weight. They thought this may be related to "old age" and did not pursue any evaluation with his primary care physician. Last night, he began to develop significant abdominal pain. It was noted that he had developed constipation. He was evaluated in the emergency room and noted to have significant constipation. After disimpaction performed by ER nurses, the patient had a bowel movement and felt significantly improved. Blood work indicated that he had an elevated sodium of 160. He was noted to be clinically dehydrated. He was referred for admission.  Clinical Impression  Pt was found to be alert and cooperative but disoriented to place, time and situation.  This is his baseline, I think.  His strength was WNL and he was able to transfer with no assistance.  He reported that he uses a walker at home and he was able to ambulate 350' with no instability.  He appears to be at functional baseline.  No further PT should be needed.    Follow Up Recommendations No PT follow up    Equipment Recommendations  None recommended by PT    Recommendations for Other Services   none    Precautions / Restrictions Precautions Precautions: Fall Restrictions Weight Bearing Restrictions: No      Mobility  Bed Mobility Overal bed mobility: Modified Independent                Transfers Overall transfer level: Modified independent Equipment used: Rolling walker (2 wheeled)                 Ambulation/Gait Ambulation/Gait assistance: Supervision Ambulation Distance (Feet): 350 Feet Assistive device: Rolling walker (2 wheeled) Gait Pattern/deviations: Trunk flexed   Gait velocity interpretation: at or above normal speed for age/gender General Gait Details: pt instructed to keep walker closer to him but he is unable to remember this for even a second  Stairs            Wheelchair Mobility    Modified Rankin (Stroke Patients Only)       Balance Overall balance assessment: Needs assistance Sitting-balance support: No upper extremity supported;Feet supported Sitting balance-Leahy Scale: Good     Standing balance support: Bilateral upper extremity supported Standing balance-Leahy Scale: Fair                               Pertinent Vitals/Pain Pain Assessment: No/denies pain    Home Living Family/patient expects to be discharged to:: Private residence                 Additional Comments: pt is very disoriented to situation, place or time...he is unable to provide any information above.  I assume it is the same as last recorded    Prior Function           Comments: unknown-pt does state that he uses a walker for gait     Hand Dominance        Extremity/Trunk Assessment   Upper  Extremity Assessment: Overall WFL for tasks assessed           Lower Extremity Assessment: Overall WFL for tasks assessed      Cervical / Trunk Assessment: Kyphotic  Communication   Communication: HOH  Cognition Arousal/Alertness: Awake/alert Behavior During Therapy: WFL for tasks assessed/performed Overall Cognitive Status: History of cognitive impairments - at baseline                      General Comments      Exercises        Assessment/Plan    PT Assessment Patent does not need any further PT services  PT Diagnosis     PT Problem List    PT Treatment Interventions     PT Goals (Current goals can be found in  the Care Plan section) Acute Rehab PT Goals PT Goal Formulation: All assessment and education complete, DC therapy    Frequency     Barriers to discharge  none known      Co-evaluation               End of Session Equipment Utilized During Treatment: Gait belt Activity Tolerance: Patient tolerated treatment well Patient left: in chair;with call bell/phone within reach;with chair alarm set Nurse Communication: Mobility status         Time: 1339-1405 PT Time Calculation (min) (ACUTE ONLY): 26 min   Charges:   PT Evaluation $Initial PT Evaluation Tier I: 1 Procedure     PT G CodesDemetrios Isaacs L 01/14/2015, 2:23 PM

## 2015-01-14 NOTE — Care Management Utilization Note (Signed)
UR completed 

## 2015-01-15 DIAGNOSIS — R630 Anorexia: Secondary | ICD-10-CM

## 2015-01-15 DIAGNOSIS — K5909 Other constipation: Secondary | ICD-10-CM

## 2015-01-15 DIAGNOSIS — I10 Essential (primary) hypertension: Secondary | ICD-10-CM

## 2015-01-15 LAB — BASIC METABOLIC PANEL
ANION GAP: 4 — AB (ref 5–15)
BUN: 13 mg/dL (ref 6–23)
CALCIUM: 9.2 mg/dL (ref 8.4–10.5)
CO2: 26 mmol/L (ref 19–32)
CREATININE: 0.97 mg/dL (ref 0.50–1.35)
Chloride: 118 mmol/L — ABNORMAL HIGH (ref 96–112)
GFR calc Af Amer: 78 mL/min — ABNORMAL LOW (ref >90)
GFR, EST NON AFRICAN AMERICAN: 67 mL/min — AB (ref >90)
GLUCOSE: 79 mg/dL (ref 70–99)
Potassium: 4.4 mmol/L (ref 3.5–5.1)
SODIUM: 148 mmol/L — AB (ref 135–145)

## 2015-01-15 MED ORDER — POLYETHYLENE GLYCOL 3350 17 G PO PACK: 17.0000 g | PACK | Freq: Every day | ORAL | Status: AC

## 2015-01-15 NOTE — Progress Notes (Signed)
Discharge instruction reviewed with patient and patient's daughter-in-law. No distress noted.

## 2015-01-15 NOTE — Discharge Summary (Addendum)
Physician Discharge Summary  Jerry [name] TML:[id number] DOB: May 24, 1916 DOA: 01/13/2015  PCP: Cyndee Brightly, MD  Admit date: 01/13/2015 Discharge date: 01/15/2015  Time spent: 25 minutes  Recommendations for Outpatient Follow-up:  1. Follow up with PCP in 1-2 weeks  Discharge Diagnoses:  Active Problems:   Hypertension   Hypernatremia   Loss of appetite   Dehydration   CN (constipation)   Abdominal pain Severe Protein Calorie Malnutrition  Discharge Condition: Improved  Diet recommendation: Regular  Filed Weights   01/13/15 1038  Weight: 44.77 kg (98 lb 11.2 oz)    History of present illness:  Please see admit h and p from 3/14 for details. Briefly, pt presented with decreased appetite, abd pain and was found to have hypernatremia into the 160's. The patient was admitted for further workup.  Hospital Course:  1. Hypernatremia. Related to dehydration and poor by mouth intake. Improved with hydration. Encouraged oral intake. Pt tolerating regular diet 2. Dehydration. Improved with IV fluids. 3. Abdominal pain related to constipation. Abdominal x-ray showed large stool burden. Manually disimpacted with improvement in symptoms 4. Failure to thrive. TSH normal. Chest x-ray did not show pneumonia. Suspect secondary to chronic constipation. Promote daily bowel movements per above.  Discharge Exam: Filed Vitals:   01/14/15 0644 01/14/15 1311 01/14/15 2240 01/15/15 0627  BP: 126/70 124/71 128/73 130/65  Pulse: 76 72 74 66  Temp: 98.6 F (37 C) 98.5 F (36.9 C) 98.7 F (37.1 C) 98.5 F (36.9 C)  TempSrc: Oral Oral Oral Oral  Resp: 20 20 18 18   Height:      Weight:      SpO2: 98% 97% 98% 97%    General: Awake, in nad Cardiovascular: regular, s1, s2 Respiratory: normal resp effort, no wheezing  Discharge Instructions     Medication List    STOP taking these medications        dextrose 5 % SOLN 50 mL with cefTRIAXone 1 G SOLR 1 g     enoxaparin 30  MG/0.3ML injection  Commonly known as:  LOVENOX     HYDROcodone-acetaminophen 5-325 MG per tablet  Commonly known as:  NORCO/VICODIN     metoprolol 50 MG tablet  Commonly known as:  LOPRESSOR      TAKE these medications        polyethylene glycol packet  Commonly known as:  MIRALAX / GLYCOLAX  Take 17 g by mouth daily.       No Known Allergies Follow-up Information    Follow up with Cyndee Brightly, MD. Schedule an appointment as soon as possible for a visit in 1 week.   Specialty:  Internal Medicine   Contact information:   [place] [address] [phone]        The results of significant diagnostics from this hospitalization (including imaging, microbiology, ancillary and laboratory) are listed below for reference.    Significant Diagnostic Studies: Dg Abd Acute W/chest  01/13/2015   CLINICAL DATA:  Periumbilical abdominal pain  EXAM: ACUTE ABDOMEN SERIES (ABDOMEN 2 VIEW & CHEST 1 VIEW)  COMPARISON:  None.  FINDINGS: There is moderate hyperinflation. The lungs are clear except for mild chronic appearing interstitial coarsening. There is a large volume of colonic stool. The abdominal gas pattern is otherwise unremarkable. There is no evidence of obstruction or perforation.  IMPRESSION: Large volume colonic stool. Negative for obstruction or perforation. Pulmonary hyperinflation.   Electronically Signed   By: Andreas Newport M.D.   On: 01/13/2015  04:28    Microbiology: No results found for this or any previous visit (from the past 240 hour(s)).   Labs: Basic Metabolic Panel:  Recent Labs Lab 01/13/15 0628 01/13/15 1106 01/14/15 0612 01/15/15 0559  NA 160* 158* 155* 148*  K 3.9 4.0 4.3 4.4  CL 122* 127* 123* 118*  CO2  --  28 27 26   GLUCOSE 137* 93 100* 79  BUN 31* 29* 21 13  CREATININE 1.10 1.16 1.01 0.97  CALCIUM  --  9.7 9.1 9.2   Liver Function Tests:  Recent Labs Lab 01/13/15 1106  AST 35  ALT 32  ALKPHOS 76  BILITOT  0.6  PROT 6.2  ALBUMIN 3.3*    Recent Labs Lab 01/13/15 1106  LIPASE 34   No results for input(s): AMMONIA in the last 168 hours. CBC:  Recent Labs Lab 01/13/15 0628 01/13/15 1106 01/14/15 0612  WBC  --  12.6* 8.5  NEUTROABS  --  8.2*  --   HGB 15.3 13.9 13.1  HCT 45.0 42.7 40.0  MCV  --  90.3 90.7  PLT  --  126* 116*   Cardiac Enzymes:  Recent Labs Lab 01/13/15 1106  TROPONINI 0.03   BNP: BNP (last 3 results) No results for input(s): BNP in the last 8760 hours.  ProBNP (last 3 results) No results for input(s): PROBNP in the last 8760 hours.  CBG: No results for input(s): GLUCAP in the last 168 hours.  Signed:  Arlena Marsan, Orpah Melter  [facility] 01/15/2015, 12:33 PM

## 2015-01-17 ENCOUNTER — Observation Stay (HOSPITAL_COMMUNITY): Payer: Commercial Managed Care - HMO

## 2015-01-17 ENCOUNTER — Encounter (HOSPITAL_COMMUNITY): Payer: Self-pay | Admitting: Emergency Medicine

## 2015-01-17 ENCOUNTER — Emergency Department (HOSPITAL_COMMUNITY): Payer: Commercial Managed Care - HMO

## 2015-01-17 ENCOUNTER — Inpatient Hospital Stay (HOSPITAL_COMMUNITY)
Admission: EM | Admit: 2015-01-17 | Discharge: 2015-01-21 | DRG: 640 | Disposition: A | Discharge status: Skilled Nursing Facility | Payer: Commercial Managed Care - HMO | Attending: Family Medicine | Admitting: Family Medicine

## 2015-01-17 DIAGNOSIS — Z681 Body mass index (BMI) 19 or less, adult: Secondary | ICD-10-CM

## 2015-01-17 DIAGNOSIS — R400 Somnolence: Secondary | ICD-10-CM | POA: Diagnosis not present

## 2015-01-17 DIAGNOSIS — K5900 Constipation, unspecified: Secondary | ICD-10-CM | POA: Diagnosis present

## 2015-01-17 DIAGNOSIS — R4182 Altered mental status, unspecified: Secondary | ICD-10-CM | POA: Diagnosis present

## 2015-01-17 DIAGNOSIS — I10 Essential (primary) hypertension: Secondary | ICD-10-CM | POA: Diagnosis present

## 2015-01-17 DIAGNOSIS — E860 Dehydration: Secondary | ICD-10-CM | POA: Diagnosis present

## 2015-01-17 DIAGNOSIS — E43 Unspecified severe protein-calorie malnutrition: Secondary | ICD-10-CM | POA: Insufficient documentation

## 2015-01-17 DIAGNOSIS — R627 Adult failure to thrive: Secondary | ICD-10-CM | POA: Diagnosis present

## 2015-01-17 DIAGNOSIS — E870 Hyperosmolality and hypernatremia: Principal | ICD-10-CM | POA: Diagnosis present

## 2015-01-17 DIAGNOSIS — Z8546 Personal history of malignant neoplasm of prostate: Secondary | ICD-10-CM

## 2015-01-17 DIAGNOSIS — F17210 Nicotine dependence, cigarettes, uncomplicated: Secondary | ICD-10-CM | POA: Diagnosis present

## 2015-01-17 DIAGNOSIS — G309 Alzheimer's disease, unspecified: Secondary | ICD-10-CM | POA: Diagnosis present

## 2015-01-17 DIAGNOSIS — Z8673 Personal history of transient ischemic attack (TIA), and cerebral infarction without residual deficits: Secondary | ICD-10-CM

## 2015-01-17 DIAGNOSIS — F0280 Dementia in other diseases classified elsewhere without behavioral disturbance: Secondary | ICD-10-CM | POA: Diagnosis present

## 2015-01-17 LAB — CBC WITH DIFFERENTIAL/PLATELET
BASOS PCT: 0 % (ref 0–1)
Basophils Absolute: 0 10*3/uL (ref 0.0–0.1)
EOS ABS: 0.1 10*3/uL (ref 0.0–0.7)
Eosinophils Relative: 1 % (ref 0–5)
HCT: 43.4 % (ref 39.0–52.0)
HEMOGLOBIN: 14.9 g/dL (ref 13.0–17.0)
Lymphocytes Relative: 20 % (ref 12–46)
Lymphs Abs: 2.2 10*3/uL (ref 0.7–4.0)
MCH: 30 pg (ref 26.0–34.0)
MCHC: 34.3 g/dL (ref 30.0–36.0)
MCV: 87.3 fL (ref 78.0–100.0)
Monocytes Absolute: 1.4 10*3/uL — ABNORMAL HIGH (ref 0.1–1.0)
Monocytes Relative: 13 % — ABNORMAL HIGH (ref 3–12)
Neutro Abs: 7.3 10*3/uL (ref 1.7–7.7)
Neutrophils Relative %: 66 % (ref 43–77)
Platelets: 133 10*3/uL — ABNORMAL LOW (ref 150–400)
RBC: 4.97 MIL/uL (ref 4.22–5.81)
RDW: 15.7 % — ABNORMAL HIGH (ref 11.5–15.5)
WBC: 11 10*3/uL — ABNORMAL HIGH (ref 4.0–10.5)

## 2015-01-17 LAB — HEPATIC FUNCTION PANEL
ALBUMIN: 4 g/dL (ref 3.5–5.2)
ALK PHOS: 107 U/L (ref 39–117)
ALT: 28 U/L (ref 0–53)
AST: 41 U/L — ABNORMAL HIGH (ref 0–37)
Bilirubin, Direct: 0.3 mg/dL (ref 0.0–0.5)
Indirect Bilirubin: 0.8 mg/dL (ref 0.3–0.9)
Total Bilirubin: 1.1 mg/dL (ref 0.3–1.2)
Total Protein: 7 g/dL (ref 6.0–8.3)

## 2015-01-17 LAB — BASIC METABOLIC PANEL
Anion gap: 10 (ref 5–15)
BUN: 14 mg/dL (ref 6–23)
CALCIUM: 9.7 mg/dL (ref 8.4–10.5)
CHLORIDE: 116 mmol/L — AB (ref 96–112)
CO2: 24 mmol/L (ref 19–32)
CREATININE: 1.05 mg/dL (ref 0.50–1.35)
GFR calc non Af Amer: 58 mL/min — ABNORMAL LOW (ref >90)
GFR, EST AFRICAN AMERICAN: 67 mL/min — AB (ref >90)
Glucose, Bld: 101 mg/dL — ABNORMAL HIGH (ref 70–99)
Potassium: 4 mmol/L (ref 3.5–5.1)
Sodium: 150 mmol/L — ABNORMAL HIGH (ref 135–145)

## 2015-01-17 LAB — URINALYSIS, ROUTINE W REFLEX MICROSCOPIC
BILIRUBIN URINE: NEGATIVE
Glucose, UA: NEGATIVE mg/dL
Ketones, ur: NEGATIVE mg/dL
LEUKOCYTES UA: NEGATIVE
NITRITE: NEGATIVE
Protein, ur: NEGATIVE mg/dL
SPECIFIC GRAVITY, URINE: 1.02 (ref 1.005–1.030)
Urobilinogen, UA: 1 mg/dL (ref 0.0–1.0)
pH: 5.5 (ref 5.0–8.0)

## 2015-01-17 LAB — URINE MICROSCOPIC-ADD ON

## 2015-01-17 LAB — TROPONIN I: Troponin I: 0.03 ng/mL (ref <0.031)

## 2015-01-17 MED ORDER — SODIUM CHLORIDE 0.9 % IV BOLUS (SEPSIS)
500.0000 mL | Freq: Once | INTRAVENOUS | Status: AC
  Administered 2015-01-17: 500 mL via INTRAVENOUS

## 2015-01-17 MED ORDER — ONDANSETRON HCL 4 MG/2ML IJ SOLN: 4.0000 mg | Freq: Four times a day (QID) | INTRAMUSCULAR | Status: DC | PRN

## 2015-01-17 MED ORDER — ACETAMINOPHEN 650 MG RE SUPP: 650.0000 mg | Freq: Four times a day (QID) | RECTAL | Status: DC | PRN

## 2015-01-17 MED ORDER — ACETAMINOPHEN 325 MG PO TABS: 650.0000 mg | ORAL_TABLET | Freq: Four times a day (QID) | ORAL | Status: DC | PRN

## 2015-01-17 MED ORDER — ENOXAPARIN SODIUM 30 MG/0.3ML ~~LOC~~ SOLN
30.0000 mg | SUBCUTANEOUS | Status: DC
  Administered 2015-01-17 – 2015-01-19 (×3): 30 mg via SUBCUTANEOUS
  Filled 2015-01-17 (×4): qty 0.3

## 2015-01-17 MED ORDER — POLYETHYLENE GLYCOL 3350 17 G PO PACK: 17.0000 g | PACK | Freq: Every day | ORAL | Status: DC | PRN

## 2015-01-17 MED ORDER — KCL IN DEXTROSE-NACL 20-5-0.45 MEQ/L-%-% IV SOLN
INTRAVENOUS | Status: DC
  Administered 2015-01-17 – 2015-01-21 (×8): via INTRAVENOUS

## 2015-01-17 MED ORDER — ALUM & MAG HYDROXIDE-SIMETH 200-200-20 MG/5ML PO SUSP: 30.0000 mL | Freq: Four times a day (QID) | ORAL | Status: DC | PRN

## 2015-01-17 MED ORDER — ONDANSETRON HCL 4 MG PO TABS: 4.0000 mg | ORAL_TABLET | Freq: Four times a day (QID) | ORAL | Status: DC | PRN

## 2015-01-17 NOTE — ED Provider Notes (Signed)
CSN: [id number]     Arrival date & time 01/17/15  1141 History  This chart was scribed for Truett Mainland, DO by Edison Simon, ED Scribe. This patient was seen in room [facility]04/[facility]04 and the patient's care was started at 12:01 PM.     Chief Complaint  Patient presents with  . Altered Mental Status   The history is provided by the patient. No language interpreter was used.    HPI Comments: Jerry [name] is a 81 y.o. male who presents to the Emergency Department complaining of altered mental status with onset yesterday. Daughter in law states he was seen here 4 days go for constipation; she states he had stopped eating 2 days ago and was dehydrated. He was hospitalized for 2 days and improved, then returned home. Daughter in law notes he fell yesterday morning, then started having altered mental status; she states he was delirious stating he was "getting ready to go to [place]" and speaking with people who were not present include his deceased wife. She states he is normally very lucid. She states they prevented him from walking because he was off balance, but seemed to be able to bear weight.  Level V caveat for altered mental status  Past Medical History  Diagnosis Date  . Cancer   . Prostate cancer    Past Surgical History  Procedure Laterality Date  . Hip pinning,cannulated  10/18/2012    Procedure: CANNULATED HIP PINNING;  Surgeon: Sanjuana Kava, MD;  Location: [facility];  Service: Orthopedics;  Laterality: Left;  Asnis Left Hip Pinning    No family history on file. History  Substance Use Topics  . Smoking status: Current Every Day Smoker -- 0.50 packs/day for 76 years    Types: Cigarettes  . Smokeless tobacco: Not on file  . Alcohol Use: Yes     Comment: occasional    Review of Systems  Unable to perform ROS: Mental status change      Allergies  Review of patient's allergies indicates no known allergies.  Home Medications   Prior to Admission medications    Medication Sig Start Date End Date Taking? Authorizing Provider  polyethylene glycol (MIRALAX / GLYCOLAX) packet Take 17 g by mouth daily. 01/15/15  Yes Donne Hazel, MD   BP 145/72 mmHg  Pulse 87  Resp 18  Wt 98 lb (44.453 kg)  SpO2 97% Physical Exam  Constitutional:  Elderly, ill-appearing, contractured  HENT:  Head: Normocephalic and atraumatic.  Mucous membranes dry  Eyes: Pupils are equal, round, and reactive to light.  Neck: Neck supple.  Cardiovascular: Normal rate, regular rhythm and normal heart sounds.   No murmur heard. Pulmonary/Chest: Effort normal and breath sounds normal. No respiratory distress. He has no wheezes.  Abdominal: Soft. Bowel sounds are normal. There is no tenderness. There is no rebound.  Musculoskeletal: He exhibits no edema.  Neurological: He is alert.  Disoriented, will not follow commands, appears to move all 4 extremities, mumbling  Skin: Skin is warm and dry. No rash noted.  Psychiatric:  Mumbling  Nursing note and vitals reviewed.   ED Course  Procedures (including critical care time)  DIAGNOSTIC STUDIES: Oxygen Saturation is 100% on room air, normal by my interpretation.    COORDINATION OF CARE: 12:06 PM Discussed treatment plan with patient at beside, the patient agrees with the plan and has no further questions at this time.   Labs Review Labs Reviewed  CBC WITH DIFFERENTIAL/PLATELET - Abnormal; Notable for the following:  WBC 11.0 (*)    RDW 15.7 (*)    Platelets 133 (*)    Monocytes Relative 13 (*)    Monocytes Absolute 1.4 (*)    All other components within normal limits  BASIC METABOLIC PANEL - Abnormal; Notable for the following:    Sodium 150 (*)    Chloride 116 (*)    Glucose, Bld 101 (*)    GFR calc non Af Amer 58 (*)    GFR calc Af Amer 67 (*)    All other components within normal limits  URINALYSIS, ROUTINE W REFLEX MICROSCOPIC - Abnormal; Notable for the following:    Hgb urine dipstick SMALL (*)    All  other components within normal limits  URINE MICROSCOPIC-ADD ON  AMMONIA  HEPATIC FUNCTION PANEL  TROPONIN I    Imaging Review Dg Pelvis 1-2 Views  01/17/2015   CLINICAL DATA:  Altered mental status. Increased weakness. Loss of appetite.  EXAM: PELVIS - 1-2 VIEW  COMPARISON:  Acute abdominal series 01/13/2015  FINDINGS: The bowel gas pattern is unremarkable. There is no evidence for obstruction or free air. Postsurgical changes are noted at the left femur.  IMPRESSION: Normal bowel gas pattern.  No evidence for obstruction or free air.  Colonic stool burden has decreased.   Electronically Signed   By: San Morelle M.D.   On: 01/17/2015 14:14   Ct Head Wo Contrast  01/17/2015   CLINICAL DATA:  Altered mental status, dehydration, fall  EXAM: CT HEAD WITHOUT CONTRAST  TECHNIQUE: Contiguous axial images were obtained from the base of the skull through the vertex without intravenous contrast.  COMPARISON:  08/01/2012  FINDINGS: No evidence of parenchymal hemorrhage or extra-axial fluid collection. No mass lesion, mass effect, or midline shift.  No CT evidence of acute infarction.  Old left cerebellar lacunar infarct. Old infarct in the right external capsule.  Subcortical white matter and periventricular small vessel ischemic changes. Intracranial atherosclerosis.  Septum cavum pellucidum.  The visualized paranasal sinuses are essentially clear. The mastoid air cells are unopacified.  No evidence of calvarial fracture.  IMPRESSION: No evidence of acute intracranial abnormality.  Old lacunar infarcts in the right external capsule and left cerebellum.  Atrophy with small vessel ischemic changes and intracranial atherosclerosis.   Electronically Signed   By: Julian Hy M.D.   On: 01/17/2015 14:27     EKG Interpretation   Date/Time:  Friday January 17 2015 14:26:37 EDT Ventricular Rate:  79 PR Interval:  129 QRS Duration: 106 QT Interval:  373 QTC Calculation: 428 R Axis:   -38 Text  Interpretation:  Sinus rhythm Left axis deviation Anteroseptal  infarct, old Baseline wander in lead(s) V2 V5 Confirmed by HORTON  MD,  [place] ([id number]) on 01/17/2015 2:51:37 PM      MDM   Final diagnoses:  Altered mental status    Patient presents with altered mental status is not fall following discharge from the hospital 2 days ago. At that time he was noted to be hypernatremic and was hydrated. Daughter-in-law reports that he is worsened at home.  Noncontributory to history taking. Basic labwork obtained and without evidence of acute abnormality. CT head negative.  Giving continued altered mental status and difficulty with ADLs at home with acute delirium, will admit to the hospital.  At hospitalist request, will add on troponin and LFTs.  I personally performed the services described in this documentation, which was scribed in my presence. The recorded information has been reviewed and is accurate.  Merryl Hacker, MD 01/17/15 [phone]

## 2015-01-17 NOTE — Clinical Social Work Note (Signed)
Per EDP, pt will be placed in observation. Daughter-in-law notified. CSW initiated bed search at [facility] and [place] facilities at her request. Weekend CSW will need to follow up with bed offers if pt is ready.   Benay Pike, [place]

## 2015-01-17 NOTE — H&P (Addendum)
Patient ID: Jerry [name], male   DOB: 12-Oct-1916, 81 y.o.   MRN: [id number]   History and Physical  Jerry [name] WUX:[id number] DOB: [birth year] DOA: 01/17/2015  Referring physician: Dr Dina Rich, ED physician PCP: Lanette Hampshire, MD   Chief Complaint: Altered mental status  HPI: Jerry [name] is a 81 y.o. male  With a history of hypernatremia, failure to thrive, and constipation who was hospitalized from 3/14 until 3/16 for those complaints. He was rehydrated, had improvement of his sodium level, and was discharged to home. Patient is under the care of his daughter-in-law and up to this point has had full mentation. Yesterday evening the patient had an unwitnessed fall striking his right head. He did not lose consciousness. His daughter-in-law sat him up and gave him ointment for the bruise. A short time later he began to have visual hallucinations have conversations with the hallucinations including his deceased wife. He was also "getting ready to go to [place]". The patient was given a dose of Ativan 0.5 mg which she had had for difficulty sleeping. He received 1 dose prior to 5 PM yesterday. He continued to be conversant with visual hallucinations for much of the evening until he fell asleep. This morning, the patient was unarousable and hypersomnolent and has remained that way since that time.   Review of Systems:  No fevers or chills per the daughter-in-law. No other review of systems as the patient is somnolent   Past Medical History  Diagnosis Date  . Cancer   . Prostate cancer    Past Surgical History  Procedure Laterality Date  . Hip pinning,cannulated  10/18/2012    Procedure: CANNULATED HIP PINNING;  Surgeon: Sanjuana Kava, MD;  Location: [facility];  Service: Orthopedics;  Laterality: Left;  Asnis Left Hip Pinning    Social History:  reports that he has been smoking Cigarettes.  He has a 38 pack-year smoking history. He does not have any smokeless tobacco history  on file. He reports that he drinks alcohol. He reports that he does not use illicit drugs. Patient lives at home & is able to participate in activities of daily living  No Known Allergies  No family history on file. unobtainable due to patient's somnolence. Per the medical chart, there are no known medical problems in the family. The patient's mother lived into her 34s and the cause of her death is unknown.   Prior to Admission medications   Medication Sig Start Date End Date Taking? Authorizing Provider  polyethylene glycol (MIRALAX / GLYCOLAX) packet Take 17 g by mouth daily. 01/15/15  Yes Donne Hazel, MD    Physical Exam: BP 145/72 mmHg  Pulse 87  Resp 18  Wt 98 lb (44.453 kg)  SpO2 97%  General: Elderly black male. Somnolent. No acute cardiopulmonary distress.  Eyes: Pupils equal, round, reactive to light. Extraocular muscles are intact. Sclerae anicteric and noninjected.  ENT: Moist mucosal membranes. No mucosal lesions. Neck: Neck supple without lymphadenopathy. No carotid bruits. No masses palpated.  Cardiovascular: Regular rate with normal S1-S2 sounds. No murmurs, rubs, gallops auscultated. No JVD.  Respiratory: Good respiratory effort with no wheezes, rales, rhonchi. Lungs clear to auscultation bilaterally.  Abdomen: Soft, nontender, nondistended. Active bowel sounds. No masses or hepatosplenomegaly  Skin: Dry, warm to touch. 2+ dorsalis pedis and radial pulses. Musculoskeletal: No calf redness.  All major joints not erythematous nontender.  Psychiatric: Unable to determine Neurologic: GCS 6  Labs on Admission:  Basic Metabolic Panel:  Recent Labs Lab 01/13/15 0628 01/13/15 1106 01/14/15 0612 01/15/15 0559 01/17/15 1225  NA 160* 158* 155* 148* 150*  K 3.9 4.0 4.3 4.4 4.0  CL 122* 127* 123* 118* 116*  CO2  --  28 27 26 24   GLUCOSE 137* 93 100* 79 101*  BUN 31* 29* 21 13 14   CREATININE 1.10 1.16 1.01 0.97 1.05  CALCIUM  --  9.7 9.1 9.2 9.7    Liver Function Tests:  Recent Labs Lab 01/13/15 1106  AST 35  ALT 32  ALKPHOS 76  BILITOT 0.6  PROT 6.2  ALBUMIN 3.3*    Recent Labs Lab 01/13/15 1106  LIPASE 34   No results for input(s): AMMONIA in the last 168 hours. CBC:  Recent Labs Lab 01/13/15 0628 01/13/15 1106 01/14/15 0612 01/17/15 1225  WBC  --  12.6* 8.5 11.0*  NEUTROABS  --  8.2*  --  7.3  HGB 15.3 13.9 13.1 14.9  HCT 45.0 42.7 40.0 43.4  MCV  --  90.3 90.7 87.3  PLT  --  126* 116* 133*   Cardiac Enzymes:  Recent Labs Lab 01/13/15 1106  TROPONINI 0.03    BNP (last 3 results) No results for input(s): BNP in the last 8760 hours.  ProBNP (last 3 results) No results for input(s): PROBNP in the last 8760 hours.  CBG: No results for input(s): GLUCAP in the last 168 hours.  Radiological Exams on Admission: Dg Pelvis 1-2 Views  01/17/2015   CLINICAL DATA:  Altered mental status. Increased weakness. Loss of appetite.  EXAM: PELVIS - 1-2 VIEW  COMPARISON:  Acute abdominal series 01/13/2015  FINDINGS: The bowel gas pattern is unremarkable. There is no evidence for obstruction or free air. Postsurgical changes are noted at the left femur.  IMPRESSION: Normal bowel gas pattern.  No evidence for obstruction or free air.  Colonic stool burden has decreased.   Electronically Signed   By: San Morelle M.D.   On: 01/17/2015 14:14   Ct Head Wo Contrast  01/17/2015   CLINICAL DATA:  Altered mental status, dehydration, fall  EXAM: CT HEAD WITHOUT CONTRAST  TECHNIQUE: Contiguous axial images were obtained from the base of the skull through the vertex without intravenous contrast.  COMPARISON:  08/01/2012  FINDINGS: No evidence of parenchymal hemorrhage or extra-axial fluid collection. No mass lesion, mass effect, or midline shift.  No CT evidence of acute infarction.  Old left cerebellar lacunar infarct. Old infarct in the right external capsule.  Subcortical white matter and periventricular small vessel  ischemic changes. Intracranial atherosclerosis.  Septum cavum pellucidum.  The visualized paranasal sinuses are essentially clear. The mastoid air cells are unopacified.  No evidence of calvarial fracture.  IMPRESSION: No evidence of acute intracranial abnormality.  Old lacunar infarcts in the right external capsule and left cerebellum.  Atrophy with small vessel ischemic changes and intracranial atherosclerosis.   Electronically Signed   By: Julian Hy M.D.   On: 01/17/2015 14:27   Dg Chest Port 1 View  01/17/2015   CLINICAL DATA:  Altered mental status.  EXAM: PORTABLE CHEST - 1 VIEW  COMPARISON:  None.  FINDINGS: Patient in fetal position for imaging. Patient had to be held down in position for imaging.  Patient rotated rightward. Normal cardiac silhouette. No effusion, infiltrate, pneumothorax.  IMPRESSION: No acute cardiopulmonary process. Some limitation due to patient positioning as above.   Electronically Signed   By: Helane Gunther.D.  On: 01/17/2015 16:33    EKG: Independently reviewed. Sinus rhythm with left axis deviation.  Old anterior septal infarct. No acute ST changes  Assessment/Plan Present on Admission:  . Altered mental status . Hypernatremia  #1 altered mental status #2 hypernatremia #3 constipation - improved #4 failure to thrive  Admit the patient to telemetry Cause of altered mental status unknown - possibly trauma related, although the head CT was negative for acute bleed or midline shift. Additionally, the patient did have a dose of lorazepam, but would seem unlikely that a single dose would cause this much stupor and the patient. LFTs, troponin, ammonia level, which I requested from the ED physician, are still pending We'll obtain MRI in the morning Additionally, we'll continue to treat the patient's hypernatremia with hypotonic IV fluids Encourage diet when somnolence has resolved   DVT prophylaxis: Lovenox  Consultants: None  Code Status: Full  code  Family Communication: Daughter-in-law   Disposition Plan: Pending  Time spent: 50 minutes was spent with face-to-face time with patient with at least 50% with counseling and coordination of care  Jerry Mainland, DO [facility] Pager [phone]

## 2015-01-17 NOTE — ED Notes (Signed)
Venous assess attempted times 3 by myself, vein assessed each time with blood return. However vein blew each time.

## 2015-01-17 NOTE — ED Notes (Signed)
Report given to [facility], all questions answered.

## 2015-01-17 NOTE — Clinical Social Work Placement (Signed)
Clinical Social Work Department CLINICAL SOCIAL WORK PLACEMENT NOTE 01/17/2015  Patient:  Jerry [name], Jerry [name]  Account Number:  [id number] Admit date:  01/17/2015  Clinical Social Worker:  Benay Pike, LCSW  Date/time:  01/17/2015 03:55 PM  Clinical Social Work is seeking post-discharge placement for this patient at the following level of care:   [facility]   (*CSW will update this form in Epic as items are completed)   01/17/2015  Patient/family provided with [place] Department of Clinical Social Work's list of facilities offering this level of care within the geographic area requested by the patient (or if unable, by the patient's family).  01/17/2015  Patient/family informed of their freedom to choose among providers that offer the needed level of care, that participate in Medicare, Medicaid or managed care program needed by the patient, have an available bed and are willing to accept the patient.  01/17/2015  Patient/family informed of [facility]' ownership interest in [facility], as well as of the fact that they are under no obligation to receive care at this facility.  PASARR submitted to EDS on  PASARR number received on   FL2 transmitted to all facilities in geographic area requested by pt/family on  01/17/2015 FL2 transmitted to all facilities within larger geographic area on   Patient informed that his/her managed care company has contracts with or will negotiate with  certain facilities, including the following:     Patient/family informed of bed offers received:   Patient chooses bed at  Physician recommends and patient chooses bed at    Patient to be transferred to  on   Patient to be transferred to facility by  Patient and family notified of transfer on  Name of family member notified:    The following physician request were entered in Epic:   Additional Comments: Pt has existing pasarr.  Benay Pike, [place]

## 2015-01-17 NOTE — ED Notes (Signed)
Per EMS-pt daughter reports increased weakness and loss of appetite since yesterday. [id number] in route.

## 2015-01-17 NOTE — Progress Notes (Signed)
CSW notified CM that pts daughter in law was requesting list of private duty agencies. List provided and explained to daughter in law. No other CM needs noted.

## 2015-01-17 NOTE — Clinical Social Work Psychosocial (Addendum)
Clinical Social Work Department BRIEF PSYCHOSOCIAL ASSESSMENT 01/17/2015  Patient:  Jerry [name], Jerry [name]     Account Number:  [id number]     Admit date:  01/17/2015  Clinical Social Worker:  Jerry [name]  Date/Time:  01/17/2015 02:26 PM  Referred by:  Physician  Date Referred:  01/17/2015 Referred for  SNF Placement   Other Referral:   Interview type:  Family Other interview type:   [place]- daughter-in-law    PSYCHOSOCIAL DATA Living Status:  FAMILY Admitted from facility:   Level of care:   Primary support name:  Jerry [name] Primary support relationship to patient:  CHILD, ADULT Degree of support available:   supportive    CURRENT CONCERNS Current Concerns  Post-Acute Placement   Other Concerns:    SOCIAL WORK ASSESSMENT / PLAN CSW met with pt's daughter-in-law Jerry [name] in ED. Pt d/c two days ago from hospital. He returned home with his son, Jerry [name] and Jerry [name] where he has lived for the past several years. At baseline, pt is fairly independent and ambulates with a walker. Jerry [name] reports that on Wednesday night after returning home, he fell. He has had altered mental status since then and stopped eating. Pt also started talking to people not present. Pt is generally oriented at baseline. He has been incontinent since his fall as well. Jerry [name] reports they are unable to care for pt in current condition. They have been considering placement since bringing him home from hospital. CSW discussed placement process and insurance authorization. Per [facility]), referral would most likely go to medical review. Latonya aware and states that she believes he could pay privately until this was complete. CSW also discussed private duty care at home as well. She feels this could be a good option as well. Jerry [name] wants to wait until Jerry [name] is present later this afternoon to make a decision. CSW also discussed with EDP. Unsure at this point if pt will require admission. Jerry [name] also  wants to wait to see if pt will be admitted.   Assessment/plan status:  Psychosocial Support/Ongoing Assessment of Needs Other assessment/ plan:   Information/referral to community resources:   SNF list  ALF list  CM for private duty care list    PATIENT'S/FAMILY'S RESPONSE TO PLAN OF CARE: Pt unable to participate in assessment. Jerry [name] provided information and is very concerned about their ability to care for pt in current condition. Facility lists provided to [employer]. Awaiting family's decision.       Jerry [name], Jerry [name]

## 2015-01-18 DIAGNOSIS — R4182 Altered mental status, unspecified: Secondary | ICD-10-CM | POA: Diagnosis present

## 2015-01-18 DIAGNOSIS — R627 Adult failure to thrive: Secondary | ICD-10-CM | POA: Diagnosis present

## 2015-01-18 DIAGNOSIS — K5900 Constipation, unspecified: Secondary | ICD-10-CM | POA: Diagnosis present

## 2015-01-18 DIAGNOSIS — E870 Hyperosmolality and hypernatremia: Secondary | ICD-10-CM | POA: Diagnosis present

## 2015-01-18 DIAGNOSIS — E860 Dehydration: Secondary | ICD-10-CM | POA: Diagnosis present

## 2015-01-18 DIAGNOSIS — Z8546 Personal history of malignant neoplasm of prostate: Secondary | ICD-10-CM | POA: Diagnosis not present

## 2015-01-18 DIAGNOSIS — F0280 Dementia in other diseases classified elsewhere without behavioral disturbance: Secondary | ICD-10-CM | POA: Diagnosis present

## 2015-01-18 DIAGNOSIS — G309 Alzheimer's disease, unspecified: Secondary | ICD-10-CM | POA: Diagnosis present

## 2015-01-18 DIAGNOSIS — E43 Unspecified severe protein-calorie malnutrition: Secondary | ICD-10-CM | POA: Diagnosis present

## 2015-01-18 DIAGNOSIS — Z8673 Personal history of transient ischemic attack (TIA), and cerebral infarction without residual deficits: Secondary | ICD-10-CM | POA: Diagnosis not present

## 2015-01-18 DIAGNOSIS — Z681 Body mass index (BMI) 19 or less, adult: Secondary | ICD-10-CM | POA: Diagnosis not present

## 2015-01-18 DIAGNOSIS — I10 Essential (primary) hypertension: Secondary | ICD-10-CM | POA: Diagnosis present

## 2015-01-18 DIAGNOSIS — F17210 Nicotine dependence, cigarettes, uncomplicated: Secondary | ICD-10-CM | POA: Diagnosis present

## 2015-01-18 LAB — BASIC METABOLIC PANEL
Anion gap: 5 (ref 5–15)
BUN: 13 mg/dL (ref 6–23)
CO2: 28 mmol/L (ref 19–32)
CREATININE: 1 mg/dL (ref 0.50–1.35)
Calcium: 9.3 mg/dL (ref 8.4–10.5)
Chloride: 117 mmol/L — ABNORMAL HIGH (ref 96–112)
GFR, EST AFRICAN AMERICAN: 71 mL/min — AB (ref >90)
GFR, EST NON AFRICAN AMERICAN: 61 mL/min — AB (ref >90)
GLUCOSE: 109 mg/dL — AB (ref 70–99)
Potassium: 4.3 mmol/L (ref 3.5–5.1)
Sodium: 150 mmol/L — ABNORMAL HIGH (ref 135–145)

## 2015-01-18 LAB — AMMONIA: Ammonia: 28 umol/L (ref 11–32)

## 2015-01-18 NOTE — Progress Notes (Addendum)
INITIAL NUTRITION ASSESSMENT Pt meets criteria for SEVERE MALNUTRITION in the context of ACUTE ILLNESS as evidenced by moderate-severe muscle wasting fat loss (documented 5 days ago). DOCUMENTATION CODES Per approved criteria  -Severe malnutrition in the context of acute illness   INTERVENTION: -Once diet is advanced recommend swallow evaluation  - Once diet is advanced, add Ensure Enlive po BID, each supplement provides 350 kcal and 20 grams of protein  -Encourage PO intake and provide assistance with feeding as needed  NUTRITION DIAGNOSIS: Inadequate oral intake related to AMS as evidenced by reportedly stopping eating and drinking ~4 weeks ago.   Goal: Pt to meet >/= 90% of their estimated nutrition needs   Monitor:  Oral intake,diet order, goals of care, labs, weight  Reason for Assessment: MST 2  81 y.o. male  Admitting Dx: <principal problem not specified>  ASSESSMENT: 81 y.o. male pmhx: hypernatremia, failure to thrive, and constipation who was hospitalized from 3/14 until 3/16 for those complaints. Readmitted d/t being unarousable and hyper somnolent.  Per 3/14 notes: Family claims pt stopped eating 3 weeks ago and he starting losing significant amounts of weight. Also reported abdominal pains/constipation at that time. Now readmitted after he hit his head. He has been having hallucinations since.  Height: Ht Readings from Last 1 Encounters:  01/17/15 5\' 7"  (1.702 m)    Weight: Wt Readings from Last 1 Encounters:  01/17/15 100 lb 1.6 oz (45.405 kg)    Ideal Body Weight: 148 lbs  % Ideal Body Weight: 68%  Wt Readings from Last 10 Encounters:  01/17/15 100 lb 1.6 oz (45.405 kg)  01/13/15 98 lb 11.2 oz (44.77 kg)  10/17/12 104 lb 15 oz (47.6 kg)  08/04/12 125 lb (56.7 kg)  08/01/12 125 lb (56.7 kg)    Usual Body Weight: ~100 lbs recently  % Usual Body Weight: 100%  BMI:  Body mass index is 15.67 kg/(m^2).  Estimated Nutritional Needs: Kcal:  1400-1600 (31-35 kcal/kg) Protein: >54 grams (1.2 g/kg) Fluid: >1.4 liters  Skin: Dry/Flaky  Diet Order: Diet NPO time specified  EDUCATION NEEDS: -No education needs identified at this time   Intake/Output Summary (Last 24 hours) at 01/18/15 1310 Last data filed at 01/18/15 0656  Gross per 24 hour  Intake   1200 ml  Output    300 ml  Net    900 ml    Last BM: 3/19  Labs:   Recent Labs Lab 01/15/15 0559 01/17/15 1225 01/18/15 0600  NA 148* 150* 150*  K 4.4 4.0 4.3  CL 118* 116* 117*  CO2 26 24 28   BUN 13 14 13   CREATININE 0.97 1.05 1.00  CALCIUM 9.2 9.7 9.3  GLUCOSE 79 101* 109*    CBG (last 3)  No results for input(s): GLUCAP in the last 72 hours.  Scheduled Meds: . enoxaparin (LOVENOX) injection  30 mg Subcutaneous Q24H    Continuous Infusions: . dextrose 5 % and 0.45 % NaCl with KCl 20 mEq/L 100 mL/hr at 01/18/15 0998    Past Medical History  Diagnosis Date  . Cancer   . Prostate cancer     Past Surgical History  Procedure Laterality Date  . Hip pinning,cannulated  10/18/2012    Procedure: CANNULATED HIP PINNING;  Surgeon: Sanjuana Kava, MD;  Location: [facility];  Service: Orthopedics;  Laterality: Left;  Asnis Left Hip Pinning     Burtis Junes RD, LDN Nutrition Pager: [phone] 01/18/2015 1:10 PM

## 2015-01-18 NOTE — Progress Notes (Signed)
Subjective: The patient is lying in fetal position but arousable. He was admitted following a fall at home and head trauma. Apparently he did not have any fractures. He had been having visual hallucinations. He did have elevated serum sodium on admission.  Objective: Vital signs in last 24 hours: Temp:  [97.5 F (36.4 C)-98.5 F (36.9 C)] 98.5 F (36.9 C) (03/19 0656) Pulse Rate:  [73-95] 73 (03/19 0656) Resp:  [18-19] 18 (03/19 0656) BP: (134-185)/(72-100) 174/74 mmHg (03/19 0656) SpO2:  [97 %-100 %] 100 % (03/19 0656) Weight:  [44.453 kg (98 lb)-45.405 kg (100 lb 1.6 oz)] 45.405 kg (100 lb 1.6 oz) (03/18 1827) Weight change:  Last BM Date: 01/18/15  Intake/Output from previous day: 03/18 0701 - 03/19 0700 In: 1200 [I.V.:1200] Out: 300 [Urine:300] Intake/Output this shift:    Physical Exam: Gen. appearance-the patient lying in fetal position but is arousable  HEENT negative  Neck supple no JVD or thyroid abnormalities  Heart regular rhythm no murmurs  Lungs clear to P&A  Abdomen no palpable organs or masses  Neurological no focal deficits   Recent Labs  01/17/15 1225  WBC 11.0*  HGB 14.9  HCT 43.4  PLT 133*   BMET  Recent Labs  01/17/15 1225 01/18/15 0600  NA 150* 150*  K 4.0 4.3  CL 116* 117*  CO2 24 28  GLUCOSE 101* 109*  BUN 14 13  CREATININE 1.05 1.00  CALCIUM 9.7 9.3    Studies/Results: Dg Pelvis 1-2 Views  01/17/2015   CLINICAL DATA:  Altered mental status. Increased weakness. Loss of appetite.  EXAM: PELVIS - 1-2 VIEW  COMPARISON:  Acute abdominal series 01/13/2015  FINDINGS: The bowel gas pattern is unremarkable. There is no evidence for obstruction or free air. Postsurgical changes are noted at the left femur.  IMPRESSION: Normal bowel gas pattern.  No evidence for obstruction or free air.  Colonic stool burden has decreased.   Electronically Signed   By: San Morelle M.D.   On: 01/17/2015 14:14   Ct Head Wo Contrast  01/17/2015    CLINICAL DATA:  Altered mental status, dehydration, fall  EXAM: CT HEAD WITHOUT CONTRAST  TECHNIQUE: Contiguous axial images were obtained from the base of the skull through the vertex without intravenous contrast.  COMPARISON:  08/01/2012  FINDINGS: No evidence of parenchymal hemorrhage or extra-axial fluid collection. No mass lesion, mass effect, or midline shift.  No CT evidence of acute infarction.  Old left cerebellar lacunar infarct. Old infarct in the right external capsule.  Subcortical white matter and periventricular small vessel ischemic changes. Intracranial atherosclerosis.  Septum cavum pellucidum.  The visualized paranasal sinuses are essentially clear. The mastoid air cells are unopacified.  No evidence of calvarial fracture.  IMPRESSION: No evidence of acute intracranial abnormality.  Old lacunar infarcts in the right external capsule and left cerebellum.  Atrophy with small vessel ischemic changes and intracranial atherosclerosis.   Electronically Signed   By: Julian Hy M.D.   On: 01/17/2015 14:27   Dg Chest Port 1 View  01/17/2015   CLINICAL DATA:  Altered mental status.  EXAM: PORTABLE CHEST - 1 VIEW  COMPARISON:  None.  FINDINGS: Patient in fetal position for imaging. Patient had to be held down in position for imaging.  Patient rotated rightward. Normal cardiac silhouette. No effusion, infiltrate, pneumothorax.  IMPRESSION: No acute cardiopulmonary process. Some limitation due to patient positioning as above.   Electronically Signed   By: Suzy Bouchard M.D.   On:  01/17/2015 16:33    Medications:  . enoxaparin (LOVENOX) injection  30 mg Subcutaneous Q24H    . dextrose 5 % and 0.45 % NaCl with KCl 20 mEq/L 100 mL/hr at 01/18/15 2103     Assessment/Plan: 1. Altered mental status with recent head trauma and visual hallucinations-plan to continue current IV fluids and will have scheduled MRI of head  2. Elevated serum sodium continue IV fluids-repeat be met in a.m.   LOS:  1 day   Tymar Polyak G 01/18/2015, 10:15 AM

## 2015-01-19 DIAGNOSIS — E43 Unspecified severe protein-calorie malnutrition: Secondary | ICD-10-CM | POA: Insufficient documentation

## 2015-01-19 LAB — CBC
HCT: 35.8 % — ABNORMAL LOW (ref 39.0–52.0)
Hemoglobin: 12.2 g/dL — ABNORMAL LOW (ref 13.0–17.0)
MCH: 29.9 pg (ref 26.0–34.0)
MCHC: 34.1 g/dL (ref 30.0–36.0)
MCV: 87.7 fL (ref 78.0–100.0)
Platelets: 135 10*3/uL — ABNORMAL LOW (ref 150–400)
RBC: 4.08 MIL/uL — ABNORMAL LOW (ref 4.22–5.81)
RDW: 15.3 % (ref 11.5–15.5)
WBC: 7.1 10*3/uL (ref 4.0–10.5)

## 2015-01-19 LAB — BASIC METABOLIC PANEL
Anion gap: 5 (ref 5–15)
BUN: 9 mg/dL (ref 6–23)
CHLORIDE: 115 mmol/L — AB (ref 96–112)
CO2: 27 mmol/L (ref 19–32)
Calcium: 9 mg/dL (ref 8.4–10.5)
Creatinine, Ser: 0.9 mg/dL (ref 0.50–1.35)
GFR calc Af Amer: 81 mL/min — ABNORMAL LOW (ref >90)
GFR calc non Af Amer: 70 mL/min — ABNORMAL LOW (ref >90)
Glucose, Bld: 96 mg/dL (ref 70–99)
Potassium: 4.5 mmol/L (ref 3.5–5.1)
SODIUM: 147 mmol/L — AB (ref 135–145)

## 2015-01-19 LAB — SEDIMENTATION RATE: Sed Rate: 5 mm/hr (ref 0–16)

## 2015-01-19 LAB — TSH: TSH: 1.014 u[IU]/mL (ref 0.350–4.500)

## 2015-01-19 NOTE — Progress Notes (Signed)
Patient arousable and answers questions appropriately no specific complaints. CT scan shows 2 old lacunar infarcts 1 and cerebellum nothing acute TYSON PARKISON NLZ:[id number] DOB: 1916/05/29 DOA: 01/17/2015 PCP: Lanette Hampshire, MD             Physical Exam: Blood pressure 144/65, pulse 60, temperature 97.5 F (36.4 C), temperature source Oral, resp. rate 20, height 5\' 7"  (1.702 m), weight 100 lb 1.6 oz (45.405 kg), SpO2 100 %. no JVD no carotid bruits lungs show prolonged respiratory phase scattered rhonchi no rales no wheezes appreciable heart regular rhythm no S3-S4 measles rubs   Investigations:  No results found for this or any previous visit (from the past 240 hour(s)).   Basic Metabolic Panel:  Recent Labs  01/18/15 0600 01/19/15 0646  NA 150* 147*  K 4.3 4.5  CL 117* 115*  CO2 28 27  GLUCOSE 109* 96  BUN 13 9  CREATININE 1.00 0.90  CALCIUM 9.3 9.0   Liver Function Tests:  Recent Labs  01/17/15 1703  AST 41*  ALT 28  ALKPHOS 107  BILITOT 1.1  PROT 7.0  ALBUMIN 4.0     CBC:  Recent Labs  01/17/15 1225  WBC 11.0*  NEUTROABS 7.3  HGB 14.9  HCT 43.4  MCV 87.3  PLT 133*    Dg Pelvis 1-2 Views  01/17/2015   CLINICAL DATA:  Altered mental status. Increased weakness. Loss of appetite.  EXAM: PELVIS - 1-2 VIEW  COMPARISON:  Acute abdominal series 01/13/2015  FINDINGS: The bowel gas pattern is unremarkable. There is no evidence for obstruction or free air. Postsurgical changes are noted at the left femur.  IMPRESSION: Normal bowel gas pattern.  No evidence for obstruction or free air.  Colonic stool burden has decreased.   Electronically Signed   By: San Morelle M.D.   On: 01/17/2015 14:14   Ct Head Wo Contrast  01/17/2015   CLINICAL DATA:  Altered mental status, dehydration, fall  EXAM: CT HEAD WITHOUT CONTRAST  TECHNIQUE: Contiguous axial images were obtained from the base of the skull through the vertex without intravenous contrast.   COMPARISON:  08/01/2012  FINDINGS: No evidence of parenchymal hemorrhage or extra-axial fluid collection. No mass lesion, mass effect, or midline shift.  No CT evidence of acute infarction.  Old left cerebellar lacunar infarct. Old infarct in the right external capsule.  Subcortical white matter and periventricular small vessel ischemic changes. Intracranial atherosclerosis.  Septum cavum pellucidum.  The visualized paranasal sinuses are essentially clear. The mastoid air cells are unopacified.  No evidence of calvarial fracture.  IMPRESSION: No evidence of acute intracranial abnormality.  Old lacunar infarcts in the right external capsule and left cerebellum.  Atrophy with small vessel ischemic changes and intracranial atherosclerosis.   Electronically Signed   By: Julian Hy M.D.   On: 01/17/2015 14:27   Dg Chest Port 1 View  01/17/2015   CLINICAL DATA:  Altered mental status.  EXAM: PORTABLE CHEST - 1 VIEW  COMPARISON:  None.  FINDINGS: Patient in fetal position for imaging. Patient had to be held down in position for imaging.  Patient rotated rightward. Normal cardiac silhouette. No effusion, infiltrate, pneumothorax.  IMPRESSION: No acute cardiopulmonary process. Some limitation due to patient positioning as above.   Electronically Signed   By: Suzy Bouchard M.D.   On: 01/17/2015 16:33      Medications:   Impression:  Active Problems:   Hypernatremia   Altered mental status   Protein-calorie malnutrition, severe  Plan: Monitor sodium and electrolytes in a.m.  Consultants:    Procedures   Antibiotics:                   Code Status:  Family Communication:    Disposition Plan monitor sodium and electrolytes in a.m.  Time spent: 30 minutes   LOS: 2 days   Kambrea Carrasco M   01/19/2015, 12:40 PM

## 2015-01-20 LAB — BASIC METABOLIC PANEL
Anion gap: 4 — ABNORMAL LOW (ref 5–15)
BUN: 7 mg/dL (ref 6–23)
CALCIUM: 9 mg/dL (ref 8.4–10.5)
CO2: 26 mmol/L (ref 19–32)
Chloride: 112 mmol/L (ref 96–112)
Creatinine, Ser: 0.94 mg/dL (ref 0.50–1.35)
GFR calc Af Amer: 79 mL/min — ABNORMAL LOW (ref >90)
GFR calc non Af Amer: 68 mL/min — ABNORMAL LOW (ref >90)
GLUCOSE: 86 mg/dL (ref 70–99)
POTASSIUM: 4.5 mmol/L (ref 3.5–5.1)
SODIUM: 142 mmol/L (ref 135–145)

## 2015-01-20 LAB — CBC WITH DIFFERENTIAL/PLATELET
BASOS PCT: 0 % (ref 0–1)
Basophils Absolute: 0 10*3/uL (ref 0.0–0.1)
Eosinophils Absolute: 0.1 10*3/uL (ref 0.0–0.7)
Eosinophils Relative: 2 % (ref 0–5)
HCT: 36.5 % — ABNORMAL LOW (ref 39.0–52.0)
Hemoglobin: 12.4 g/dL — ABNORMAL LOW (ref 13.0–17.0)
LYMPHS ABS: 2.5 10*3/uL (ref 0.7–4.0)
Lymphocytes Relative: 36 % (ref 12–46)
MCH: 29.9 pg (ref 26.0–34.0)
MCHC: 34 g/dL (ref 30.0–36.0)
MCV: 88 fL (ref 78.0–100.0)
Monocytes Absolute: 1 10*3/uL (ref 0.1–1.0)
Monocytes Relative: 15 % — ABNORMAL HIGH (ref 3–12)
NEUTROS PCT: 47 % (ref 43–77)
Neutro Abs: 3.2 10*3/uL (ref 1.7–7.7)
PLATELETS: 147 10*3/uL — AB (ref 150–400)
RBC: 4.15 MIL/uL — AB (ref 4.22–5.81)
RDW: 15.4 % (ref 11.5–15.5)
WBC: 6.8 10*3/uL (ref 4.0–10.5)

## 2015-01-20 LAB — RAPID URINE DRUG SCREEN, HOSP PERFORMED
AMPHETAMINES: NOT DETECTED
BARBITURATES: NOT DETECTED
BENZODIAZEPINES: NOT DETECTED
COCAINE: NOT DETECTED
Opiates: NOT DETECTED
Tetrahydrocannabinol: NOT DETECTED

## 2015-01-20 LAB — VITAMIN B12: Vitamin B-12: 346 pg/mL (ref 211–911)

## 2015-01-20 LAB — FOLATE

## 2015-01-20 MED ORDER — TUBERCULIN PPD 5 UNIT/0.1ML ID SOLN
5.0000 [IU] | Freq: Once | INTRADERMAL | Status: DC
  Filled 2015-01-20: qty 0.1

## 2015-01-20 MED ORDER — ENSURE ENLIVE PO LIQD
237.0000 mL | Freq: Two times a day (BID) | ORAL | Status: DC
  Administered 2015-01-20 – 2015-01-21 (×2): 237 mL via ORAL

## 2015-01-20 NOTE — Discharge Summary (Signed)
Physician Discharge Summary  Jerry [name] OZH:[id number] DOB: 13-Feb-1916 DOA: 01/17/2015  PCP: Lanette Hampshire, MD  Admit date: 01/17/2015 Discharge date: 01/20/2015     Discharge Diagnoses:  1. Altered mental status-dementia Alzheimer type 2. Hypernatremia 3. Dehydration 4. Small remote lacunar infarctions head 5. Essential hypertension 6. Failure to thrive 7.  8.   Discharge Condition: Stable Disposition: Discharged to nursing facility  Diet recommendation: Regular  Filed Weights   01/17/15 1141 01/17/15 1827  Weight: 44.453 kg (98 lb) 45.405 kg (100 lb 1.6 oz)    History of present illness:  The patient had been seen in the emergency room several days prior to his admission. He had developed abdominal pain due to constipation. Disimpaction was performed by ED nurses. It was noted that he was clinically dehydrated and had elevated serum sodium level. He was subsequently admitted to [place] floor. He apparently had a fall at home prior to admission. Subsequent CT of the head showed old lacunar infarcts but no evidence of recent trauma or injury Hospital Course:  The patient remained somnolent during the first 24-48 hours of admission. As stated previously CT of the head showed evidence of old lacunar infarctions but no acute injury or acute infarct. He did have elevated serum sodium. With D5 half-normal saline the serum sodium level gradually returned to more normal range and the patient became more alert. He was started on regular diet which he tolerated. He did receive subcutaneous Lovenox for DVT prophylaxis. It was clear that the family could not take him back home. Social services did search for placement and found rest home for him. Since patient had previous constipation problems and repeat acute abdominal series was done which showed no evidence of obstruction or constipation. He was treated with MiraLAX 17 g daily   Discharge Instructions The patient is discharged to  nursing facility and should continue medications listed below. He should have daily food supplement i.e. boost or or ensure    Medication List    ASK your doctor about these medications        polyethylene glycol packet  Commonly known as:  MIRALAX / GLYCOLAX  Take 17 g by mouth daily.       No Known Allergies  The results of significant diagnostics from this hospitalization (including imaging, microbiology, ancillary and laboratory) are listed below for reference.    Significant Diagnostic Studies: Dg Pelvis 1-2 Views  01/17/2015   CLINICAL DATA:  Altered mental status. Increased weakness. Loss of appetite.  EXAM: PELVIS - 1-2 VIEW  COMPARISON:  Acute abdominal series 01/13/2015  FINDINGS: The bowel gas pattern is unremarkable. There is no evidence for obstruction or free air. Postsurgical changes are noted at the left femur.  IMPRESSION: Normal bowel gas pattern.  No evidence for obstruction or free air.  Colonic stool burden has decreased.   Electronically Signed   By: San Morelle M.D.   On: 01/17/2015 14:14   Ct Head Wo Contrast  01/17/2015   CLINICAL DATA:  Altered mental status, dehydration, fall  EXAM: CT HEAD WITHOUT CONTRAST  TECHNIQUE: Contiguous axial images were obtained from the base of the skull through the vertex without intravenous contrast.  COMPARISON:  08/01/2012  FINDINGS: No evidence of parenchymal hemorrhage or extra-axial fluid collection. No mass lesion, mass effect, or midline shift.  No CT evidence of acute infarction.  Old left cerebellar lacunar infarct. Old infarct in the right external capsule.  Subcortical white matter and periventricular small vessel ischemic changes. Intracranial  atherosclerosis.  Septum cavum pellucidum.  The visualized paranasal sinuses are essentially clear. The mastoid air cells are unopacified.  No evidence of calvarial fracture.  IMPRESSION: No evidence of acute intracranial abnormality.  Old lacunar infarcts in the right external  capsule and left cerebellum.  Atrophy with small vessel ischemic changes and intracranial atherosclerosis.   Electronically Signed   By: Julian Hy M.D.   On: 01/17/2015 14:27   Dg Chest Port 1 View  01/17/2015   CLINICAL DATA:  Altered mental status.  EXAM: PORTABLE CHEST - 1 VIEW  COMPARISON:  None.  FINDINGS: Patient in fetal position for imaging. Patient had to be held down in position for imaging.  Patient rotated rightward. Normal cardiac silhouette. No effusion, infiltrate, pneumothorax.  IMPRESSION: No acute cardiopulmonary process. Some limitation due to patient positioning as above.   Electronically Signed   By: Suzy Bouchard M.D.   On: 01/17/2015 16:33   Dg Abd Acute W/chest  01/13/2015   CLINICAL DATA:  Periumbilical abdominal pain  EXAM: ACUTE ABDOMEN SERIES (ABDOMEN 2 VIEW & CHEST 1 VIEW)  COMPARISON:  None.  FINDINGS: There is moderate hyperinflation. The lungs are clear except for mild chronic appearing interstitial coarsening. There is a large volume of colonic stool. The abdominal gas pattern is otherwise unremarkable. There is no evidence of obstruction or perforation.  IMPRESSION: Large volume colonic stool. Negative for obstruction or perforation. Pulmonary hyperinflation.   Electronically Signed   By: Andreas Newport M.D.   On: 01/13/2015 04:28    Microbiology: No results found for this or any previous visit (from the past 240 hour(s)).   Labs: Basic Metabolic Panel:  Recent Labs Lab 01/15/15 0559 01/17/15 1225 01/18/15 0600 01/19/15 0646 01/20/15 0755  NA 148* 150* 150* 147* 142  K 4.4 4.0 4.3 4.5 4.5  CL 118* 116* 117* 115* 112  CO2 26 24 28 27 26   GLUCOSE 79 101* 109* 96 86  BUN 13 14 13 9 7   CREATININE 0.97 1.05 1.00 0.90 0.94  CALCIUM 9.2 9.7 9.3 9.0 9.0   Liver Function Tests:  Recent Labs Lab 01/17/15 1703  AST 41*  ALT 28  ALKPHOS 107  BILITOT 1.1  PROT 7.0  ALBUMIN 4.0   No results for input(s): LIPASE, AMYLASE in the last 168  hours.  Recent Labs Lab 01/18/15 0600  AMMONIA 28   CBC:  Recent Labs Lab 01/14/15 0612 01/17/15 1225 01/19/15 1345 01/20/15 0755  WBC 8.5 11.0* 7.1 6.8  NEUTROABS  --  7.3  --  3.2  HGB 13.1 14.9 12.2* 12.4*  HCT 40.0 43.4 35.8* 36.5*  MCV 90.7 87.3 87.7 88.0  PLT 116* 133* 135* 147*   Cardiac Enzymes:  Recent Labs Lab 01/17/15 1703  TROPONINI 0.03   BNP: BNP (last 3 results) No results for input(s): BNP in the last 8760 hours.  ProBNP (last 3 results) No results for input(s): PROBNP in the last 8760 hours.  CBG: No results for input(s): GLUCAP in the last 168 hours.  Active Problems:   Hypernatremia   Altered mental status   Protein-calorie malnutrition, severe   Time coordinating discharge: 45 minutes Signed:  Marjean Donna, MD 01/20/2015, 5:38 PM

## 2015-01-20 NOTE — Clinical Social Work Note (Signed)
CSW discussed d/c plan with pt's son and daughter-in-law. Pt worked with PT and is at baseline, not requiring any follow up. CSW shared this with family and they would still prefer for pt to go to placement. They are agreeable to ALF/[facility] and can afford $1500 a month. CSW requested TB skin test. Referral faxed. ALF list given to family on Friday in ED.  Benay Pike, [place]

## 2015-01-20 NOTE — Plan of Care (Signed)
TB INJECTION ATTEMPTED TO BE GIVEN.  PT REFUSED TO LET ME ADMINISTER THE INJECTION.  PASSED ON TO DOMINIQUE THE INJECTION WAS NOT GIVEN.  THEY CAN RE TRY IN THE MORNING SINCE THIS WHEN THE PT STATES HE WANTS TO GET IT.

## 2015-01-20 NOTE — Progress Notes (Signed)
Subjective: The patient is more alert today. She was admitted following a fall at home with head trauma. No fractures. His had visual hallucinations. He did have elevated serum sodium on admission but this is returning to a more normal range  Objective: Vital signs in last 24 hours: Temp:  [97.6 F (36.4 C)-99.4 F (37.4 C)] 98.3 F (36.8 C) (03/21 0420) Pulse Rate:  [53-66] 66 (03/21 0420) Resp:  [16-19] 18 (03/21 0420) BP: (135-152)/(54-74) 135/74 mmHg (03/21 0420) SpO2:  [100 %] 100 % (03/21 0420) Weight change:  Last BM Date: 01/19/15  Intake/Output from previous day: 03/20 0701 - 03/21 0700 In: -  Out: 1500 [Urine:1500] Intake/Output this shift: Total I/O In: -  Out: 1050 [Urine:1050]  Physical Exam: Gen. appearance-the patient is lying in fetal position but is arousable  HEENT negative  Neck supple no JVD or thyroid abnormalities  Heart regular rhythm no murmurs  Lungs clear to P&A  Abdomen no palpable organs or masses  Neurological no focal deficits   Recent Labs  01/17/15 1225 01/19/15 1345  WBC 11.0* 7.1  HGB 14.9 12.2*  HCT 43.4 35.8*  PLT 133* 135*   BMET  Recent Labs  01/18/15 0600 01/19/15 0646  NA 150* 147*  K 4.3 4.5  CL 117* 115*  CO2 28 27  GLUCOSE 109* 96  BUN 13 9  CREATININE 1.00 0.90  CALCIUM 9.3 9.0    Studies/Results: No results found.  Medications:  . enoxaparin (LOVENOX) injection  30 mg Subcutaneous Q24H    . dextrose 5 % and 0.45 % NaCl with KCl 20 mEq/L 100 mL/hr at 01/20/15 0348     Assessment/Plan: 1. Altered mental status with recent head trauma and visual hallucinations-plan to proceed with MRI of head continue IV fluids.  2. Elevated serum sodium-continue IV fluids  Patient will be placed in nursing facility when bed is available and when MRI completed   LOS: 3 days   Evon Dejarnett G 01/20/2015, 6:19 AM

## 2015-01-20 NOTE — Evaluation (Signed)
Physical Therapy Evaluation Patient Details Name: Jerry [name] MRN: [id number] DOB: 1916-01-21 Today's Date: 01/20/2015   History of Present Illness  With a history of hypernatremia, failure to thrive, and constipation who was hospitalized from 3/14 until 3/16 for those complaints. He was rehydrated, had improvement of his sodium level, and was discharged to home. Patient is under the care of his daughter-in-law and up to this point has had full mentation. Yesterday evening the patient had an unwitnessed fall striking his right head. He did not lose consciousness. His daughter-in-law sat him up and gave him ointment for the bruise. A short time later he began to have visual hallucinations have conversations with the hallucinations including his deceased wife. He was also "getting ready to go to [place]". The patient was given a dose of Ativan 0.5 mg which she had had for difficulty sleeping. He received 1 dose prior to 5 PM yesterday. He continued to be conversant with visual hallucinations for much of the evening until he fell asleep. This morning, the patient was unarousable and hypersomnolent and has remained that way since that time.  Clinical Impression  *This morning pt is easily arousable and very cooperative.  His evaluation is unchanged from when seen a week ago.  His strength is WNL, balance with a walker is stable.  He is able to transfer independently and he is able to ambulate 300' with a walker and no instability.  He was cued many times to keep closer to the walker.  He was able to do so but then immediately forgot.  He is appropriate for ACLF if family cannot manage him at home.    Follow Up Recommendations No PT follow up    Equipment Recommendations  None recommended by PT    Recommendations for Other Services   none    Precautions / Restrictions Precautions Precautions: Fall Restrictions Weight Bearing Restrictions: No      Mobility  Bed Mobility Overal bed  mobility: Modified Independent                Transfers Overall transfer level: Modified independent Equipment used: Rolling walker (2 wheeled)                Ambulation/Gait   Ambulation Distance (Feet): 300 Feet Assistive device: Rolling walker (2 wheeled) Gait Pattern/deviations: Trunk flexed     General Gait Details: pt instructed to keep walker closer to him but he is unable to remember this for even a second  Stairs            Wheelchair Mobility    Modified Rankin (Stroke Patients Only)       Balance Overall balance assessment: Needs assistance Sitting-balance support: No upper extremity supported;Feet supported Sitting balance-Leahy Scale: Good     Standing balance support: Bilateral upper extremity supported Standing balance-Leahy Scale: Fair                               Pertinent Vitals/Pain Pain Assessment: No/denies pain    Home Living Family/patient expects to be discharged to:: Unsure                 Additional Comments: pt is very disoriented to situation, place or time...he is unable to provide any information above.  I assume it is the same as last recorded    Prior Function           Comments: unknown-pt does state that he  uses a walker for gait     Hand Dominance        Extremity/Trunk Assessment   Upper Extremity Assessment: Overall WFL for tasks assessed           Lower Extremity Assessment: Overall WFL for tasks assessed      Cervical / Trunk Assessment: Kyphotic  Communication   Communication: HOH  Cognition Arousal/Alertness: Awake/alert Behavior During Therapy: WFL for tasks assessed/performed Overall Cognitive Status: History of cognitive impairments - at baseline                      General Comments      Exercises        Assessment/Plan    PT Assessment Patent does not need any further PT services  PT Diagnosis     PT Problem List    PT Treatment  Interventions     PT Goals (Current goals can be found in the Care Plan section) Acute Rehab PT Goals PT Goal Formulation: All assessment and education complete, DC therapy    Frequency     Barriers to discharge  unknown      Co-evaluation               End of Session Equipment Utilized During Treatment: Gait belt Activity Tolerance: Patient tolerated treatment well Patient left: in chair;with chair alarm set;with call bell/phone within reach           Time: 0835-0902 PT Time Calculation (min) (ACUTE ONLY): 27 min   Charges:   PT Evaluation $Initial PT Evaluation Tier I: 1 Procedure     PT G CodesDemetrios Isaacs L 01/20/2015, 9:20 AM

## 2015-01-20 NOTE — Clinical Social Work Placement (Signed)
Clinical Social Work Department CLINICAL SOCIAL WORK PLACEMENT NOTE 01/20/2015  Patient:  Jerry [name], Jerry [name]  Account Number:  [id number] Admit date:  01/17/2015  Clinical Social Worker:  Benay Pike, LCSW  Date/time:  01/20/2015 10:14 AM  Clinical Social Work is seeking post-discharge placement for this patient at the following level of care:   ASSISTED LIVING/REST HOME   (*CSW will update this form in Epic as items are completed)   01/17/2015  Patient/family provided with [place] Department of Clinical Social Work's list of facilities offering this level of care within the geographic area requested by the patient (or if unable, by the patient's family).  01/20/2015  Patient/family informed of their freedom to choose among providers that offer the needed level of care, that participate in Medicare, Medicaid or managed care program needed by the patient, have an available bed and are willing to accept the patient.  01/20/2015  Patient/family informed of [facility]' ownership interest in [facility], as well as of the fact that they are under no obligation to receive care at this facility.  PASARR submitted to EDS on  PASARR number received on   FL2 transmitted to all facilities in geographic area requested by pt/family on  01/20/2015 FL2 transmitted to all facilities within larger geographic area on   Patient informed that his/her managed care company has contracts with or will negotiate with  certain facilities, including the following:     Patient/family informed of bed offers received:   Patient chooses bed at  Physician recommends and patient chooses bed at    Patient to be transferred to  on   Patient to be transferred to facility by  Patient and family notified of transfer on  Name of family member notified:    The following physician request were entered in Epic:   Additional Comments:  Benay Pike, [place]

## 2015-01-20 NOTE — Care Management Note (Addendum)
    Page 1 of 1   01/21/2015     7:45:36 AM CARE MANAGEMENT NOTE 01/21/2015  Patient:  Jerry [name], Jerry [name]   Account Number:  [id number]  Date Initiated:  01/20/2015  Documentation initiated by:  Theophilus Kinds  Subjective/Objective Assessment:   Pt admitted from home with altered mental status and hypernatremia. Pt lives with family but family is unable to care for pt anymore and would like pt placed.     Action/Plan:   PT recommends ALF. Pt is agreeable. CSW is aware and will start bed search.   Anticipated DC Date:  01/21/2015   Anticipated DC Plan:  ASSISTED LIVING / REST HOME  In-house referral  Clinical Social Worker      DC Planning Services  CM consult      Choice offered to / List presented to:             Status of service:  Completed, signed off Medicare Important Message given?  YES (If response is "NO", the following Medicare IM given date fields will be blank) Date Medicare IM given:  01/21/2015 Medicare IM given by:  Theophilus Kinds Date Additional Medicare IM given:   Additional Medicare IM given by:    Discharge Disposition:  ASSISTED LIVING  Per UR Regulation:    If discussed at Long Length of Stay Meetings, dates discussed:    Comments:  01/21/15 Wiley Ford, RN BSN CM Pt discharged to [facility] home today. CSW to arrange discharge to facility.  01/20/15 [place], RN BSN CM

## 2015-01-21 ENCOUNTER — Inpatient Hospital Stay (HOSPITAL_COMMUNITY): Payer: Commercial Managed Care - HMO

## 2015-01-21 MED ORDER — TUBERCULIN PPD 5 UNIT/0.1ML ID SOLN
5.0000 [IU] | Freq: Once | INTRADERMAL | Status: DC
  Administered 2015-01-21: 5 [IU] via INTRADERMAL
  Filled 2015-01-21: qty 0.1

## 2015-01-21 MED ORDER — LORAZEPAM 1 MG PO TABS
1.0000 mg | ORAL_TABLET | Freq: Once | ORAL | Status: AC
  Administered 2015-01-21: 1 mg via ORAL
  Filled 2015-01-21: qty 1

## 2015-01-21 NOTE — Clinical Social Work Placement (Signed)
Clinical Social Work Department CLINICAL SOCIAL WORK PLACEMENT NOTE 01/21/2015  Patient:  Jerry [name], Jerry [name]  Account Number:  [id number] Admit date:  01/17/2015  Clinical Social Worker:  Benay Pike, LCSW  Date/time:  01/20/2015 10:14 AM  Clinical Social Work is seeking post-discharge placement for this patient at the following level of care:   ASSISTED LIVING/REST HOME   (*CSW will update this form in Epic as items are completed)   01/17/2015  Patient/family provided with [place] Department of Clinical Social Work's list of facilities offering this level of care within the geographic area requested by the patient (or if unable, by the patient's family).  01/20/2015  Patient/family informed of their freedom to choose among providers that offer the needed level of care, that participate in Medicare, Medicaid or managed care program needed by the patient, have an available bed and are willing to accept the patient.  01/20/2015  Patient/family informed of [facility]' ownership interest in [facility], as well as of the fact that they are under no obligation to receive care at this facility.  PASARR submitted to EDS on  PASARR number received on   FL2 transmitted to all facilities in geographic area requested by pt/family on  01/20/2015 FL2 transmitted to all facilities within larger geographic area on   Patient informed that his/her managed care company has contracts with or will negotiate with  certain facilities, including the following:     Patient/family informed of bed offers received:  01/21/2015 Patient chooses bed at Other Physician recommends and patient chooses bed at    Patient to be transferred to Other on  01/21/2015 Patient to be transferred to facility by family Patient and family notified of transfer on 01/21/2015 Name of family member notified:  Virgilio Belling: daughter-in-law  The following physician request were entered in Epic:   Additional  Comments:  Benay Pike, [place]

## 2015-01-21 NOTE — Progress Notes (Signed)
TB skin test done left forearm and mark.

## 2015-01-21 NOTE — Progress Notes (Signed)
Patient alert arousable and conversant disoriented which is chronic TRUTH BAROT LLV:[id number] DOB: 03/20/1916 DOA: 01/17/2015 PCP: Lanette Hampshire, MD             Physical Exam: Blood pressure 153/55, pulse 63, temperature 97.8 F (36.6 C), temperature source Oral, resp. rate 18, height 5' 7"  (1.702 m), weight 100 lb 1.6 oz (45.405 kg), SpO2 100 %. lungs clear to A&P no rales wheeze rhonchi heart regular rhythm no murmurs, sees feels rubs abdomen soft nontender bowel sounds normoactive   Investigations:  No results found for this or any previous visit (from the past 240 hour(s)).   Basic Metabolic Panel:  Recent Labs  01/19/15 0646 01/20/15 0755  NA 147* 142  K 4.5 4.5  CL 115* 112  CO2 27 26  GLUCOSE 96 86  BUN 9 7  CREATININE 0.90 0.94  CALCIUM 9.0 9.0   Liver Function Tests: No results for input(s): AST, ALT, ALKPHOS, BILITOT, PROT, ALBUMIN in the last 72 hours.   CBC:  Recent Labs  01/19/15 1345 01/20/15 0755  WBC 7.1 6.8  NEUTROABS  --  3.2  HGB 12.2* 12.4*  HCT 35.8* 36.5*  MCV 87.7 88.0  PLT 135* 147*    Mr Brain Wo Contrast  01/21/2015   CLINICAL DATA:  Altered mental status. Hypernatremia. Malnutrition.  EXAM: MRI HEAD WITHOUT CONTRAST  TECHNIQUE: Multiplanar, multiecho pulse sequences of the brain and surrounding structures were obtained without intravenous contrast.  COMPARISON:  CT head 01/17/2015  FINDINGS: Image quality degraded by moderate motion. This degree of motion limits diagnostic accuracy.  Moderate atrophy with prominent subarachnoid space and ventricles. Mild chronic microvascular ischemic changes in the white matter and basal ganglia and cerebellum bilaterally.  Ill-defined diffusion hyperintensity in the right frontal and temporal lobe and right cerebellum felt to be artifactual. This does not conform to vascular territory. One consideration would be herpes encephalitis however the cerebellar asymmetry with suggests this is all  part of artifact from motion. Patient does not have fever or white count to my knowledge.  Negative for hemorrhage or mass lesion.  IMPRESSION: This examination is significantly degraded by motion.  Generalized atrophy with chronic microvascular ischemia.  Asymmetric diffusion signal in the right hemisphere right cerebellum felt to be artifactual.   Electronically Signed   By: Franchot Gallo M.D.   On: 01/21/2015 12:45      Medications:   Impression:  Active Problems:   Hypernatremia   Altered mental status   Protein-calorie malnutrition, severe     Plan: Monitor electrolytes patient awaiting placement in SNF  Consultants:    Procedures   Antibiotics:                   Code Status:   Family Communication:    Disposition Plan monitor be met in a.m. await placement and S and  Time spent: 30 minutes   LOS: 4 days   Phelicia Dantes M   01/21/2015, 1:02 PM

## 2015-01-21 NOTE — Clinical Social Work Note (Signed)
Family notified this morning that [facility] could offer bed to pt. Family visited facility this afternoon and accept. Pt d/c today. Family will provide transport. D/C summary and FL2 faxed.   Benay Pike, [place]

## 2015-01-21 NOTE — Progress Notes (Signed)
Patient removed condom cath. IV removed. Family providing transportation. Patient taken to lobby via wheelchair.

## 2015-01-31 LAB — RPR: RPR Ser Ql: NONREACTIVE

## 2015-02-04 NOTE — Progress Notes (Signed)
UR chart review completed.  

## 2015-05-28 ENCOUNTER — Emergency Department (HOSPITAL_COMMUNITY)

## 2015-05-28 ENCOUNTER — Encounter (HOSPITAL_COMMUNITY): Payer: Self-pay | Admitting: Emergency Medicine

## 2015-05-28 ENCOUNTER — Inpatient Hospital Stay (HOSPITAL_COMMUNITY)
Admission: EM | Admit: 2015-05-28 | Discharge: 2015-06-07 | DRG: 682 | Disposition: A | Discharge status: Hospice | Attending: Pulmonary Disease | Admitting: Pulmonary Disease

## 2015-05-28 DIAGNOSIS — T17920A Food in respiratory tract, part unspecified causing asphyxiation, initial encounter: Secondary | ICD-10-CM | POA: Diagnosis not present

## 2015-05-28 DIAGNOSIS — F0280 Dementia in other diseases classified elsewhere without behavioral disturbance: Secondary | ICD-10-CM | POA: Diagnosis present

## 2015-05-28 DIAGNOSIS — F0390 Unspecified dementia without behavioral disturbance: Secondary | ICD-10-CM | POA: Diagnosis present

## 2015-05-28 DIAGNOSIS — N182 Chronic kidney disease, stage 2 (mild): Secondary | ICD-10-CM | POA: Diagnosis present

## 2015-05-28 DIAGNOSIS — Z8673 Personal history of transient ischemic attack (TIA), and cerebral infarction without residual deficits: Secondary | ICD-10-CM | POA: Diagnosis not present

## 2015-05-28 DIAGNOSIS — G309 Alzheimer's disease, unspecified: Secondary | ICD-10-CM | POA: Diagnosis present

## 2015-05-28 DIAGNOSIS — N179 Acute kidney failure, unspecified: Principal | ICD-10-CM | POA: Diagnosis present

## 2015-05-28 DIAGNOSIS — R680 Hypothermia, not associated with low environmental temperature: Secondary | ICD-10-CM | POA: Diagnosis not present

## 2015-05-28 DIAGNOSIS — T17998S Other foreign object in respiratory tract, part unspecified causing other injury, sequela: Secondary | ICD-10-CM | POA: Diagnosis not present

## 2015-05-28 DIAGNOSIS — E876 Hypokalemia: Secondary | ICD-10-CM | POA: Diagnosis not present

## 2015-05-28 DIAGNOSIS — J690 Pneumonitis due to inhalation of food and vomit: Secondary | ICD-10-CM | POA: Diagnosis not present

## 2015-05-28 DIAGNOSIS — I10 Essential (primary) hypertension: Secondary | ICD-10-CM | POA: Diagnosis present

## 2015-05-28 DIAGNOSIS — D72829 Elevated white blood cell count, unspecified: Secondary | ICD-10-CM | POA: Diagnosis not present

## 2015-05-28 DIAGNOSIS — C61 Malignant neoplasm of prostate: Secondary | ICD-10-CM | POA: Diagnosis present

## 2015-05-28 DIAGNOSIS — Z515 Encounter for palliative care: Secondary | ICD-10-CM | POA: Diagnosis not present

## 2015-05-28 DIAGNOSIS — R319 Hematuria, unspecified: Secondary | ICD-10-CM | POA: Diagnosis present

## 2015-05-28 DIAGNOSIS — R627 Adult failure to thrive: Secondary | ICD-10-CM | POA: Diagnosis present

## 2015-05-28 DIAGNOSIS — E860 Dehydration: Secondary | ICD-10-CM | POA: Diagnosis present

## 2015-05-28 DIAGNOSIS — R198 Other specified symptoms and signs involving the digestive system and abdomen: Secondary | ICD-10-CM

## 2015-05-28 DIAGNOSIS — I959 Hypotension, unspecified: Secondary | ICD-10-CM | POA: Diagnosis not present

## 2015-05-28 DIAGNOSIS — Z66 Do not resuscitate: Secondary | ICD-10-CM | POA: Diagnosis present

## 2015-05-28 DIAGNOSIS — E870 Hyperosmolality and hypernatremia: Secondary | ICD-10-CM | POA: Diagnosis present

## 2015-05-28 DIAGNOSIS — Z720 Tobacco use: Secondary | ICD-10-CM

## 2015-05-28 DIAGNOSIS — B958 Unspecified staphylococcus as the cause of diseases classified elsewhere: Secondary | ICD-10-CM | POA: Diagnosis present

## 2015-05-28 DIAGNOSIS — Z681 Body mass index (BMI) 19 or less, adult: Secondary | ICD-10-CM

## 2015-05-28 DIAGNOSIS — T17908A Unspecified foreign body in respiratory tract, part unspecified causing other injury, initial encounter: Secondary | ICD-10-CM | POA: Diagnosis not present

## 2015-05-28 DIAGNOSIS — J9600 Acute respiratory failure, unspecified whether with hypoxia or hypercapnia: Secondary | ICD-10-CM | POA: Diagnosis not present

## 2015-05-28 DIAGNOSIS — D649 Anemia, unspecified: Secondary | ICD-10-CM | POA: Diagnosis not present

## 2015-05-28 DIAGNOSIS — Z7189 Other specified counseling: Secondary | ICD-10-CM | POA: Diagnosis not present

## 2015-05-28 DIAGNOSIS — N390 Urinary tract infection, site not specified: Secondary | ICD-10-CM | POA: Diagnosis present

## 2015-05-28 DIAGNOSIS — J9690 Respiratory failure, unspecified, unspecified whether with hypoxia or hypercapnia: Secondary | ICD-10-CM

## 2015-05-28 DIAGNOSIS — R6251 Failure to thrive (child): Secondary | ICD-10-CM | POA: Diagnosis present

## 2015-05-28 DIAGNOSIS — E43 Unspecified severe protein-calorie malnutrition: Secondary | ICD-10-CM | POA: Diagnosis present

## 2015-05-28 DIAGNOSIS — I129 Hypertensive chronic kidney disease with stage 1 through stage 4 chronic kidney disease, or unspecified chronic kidney disease: Secondary | ICD-10-CM | POA: Diagnosis present

## 2015-05-28 HISTORY — DX: Adult failure to thrive: R62.7

## 2015-05-28 HISTORY — DX: Dementia in other diseases classified elsewhere, unspecified severity, without behavioral disturbance, psychotic disturbance, mood disturbance, and anxiety: F02.80

## 2015-05-28 HISTORY — DX: Alzheimer's disease, unspecified: G30.9

## 2015-05-28 HISTORY — DX: Cerebral infarction, unspecified: I63.9

## 2015-05-28 HISTORY — DX: Somnolence: R40.0

## 2015-05-28 LAB — BASIC METABOLIC PANEL
BUN: 86 mg/dL — AB (ref 6–20)
CO2: 22 mmol/L (ref 22–32)
Calcium: 10 mg/dL (ref 8.9–10.3)
Chloride: >130 mmol/L (ref 101–111)
Creatinine, Ser: 2.23 mg/dL — ABNORMAL HIGH (ref 0.61–1.24)
GFR calc non Af Amer: 23 mL/min — ABNORMAL LOW (ref >60)
GFR, EST AFRICAN AMERICAN: 27 mL/min — AB (ref >60)
Glucose, Bld: 82 mg/dL (ref 65–99)
Potassium: 3.6 mmol/L (ref 3.5–5.1)
Sodium: 178 mmol/L (ref 135–145)

## 2015-05-28 LAB — CBC WITH DIFFERENTIAL/PLATELET
BASOS ABS: 0 10*3/uL (ref 0.0–0.1)
Basophils Relative: 0 % (ref 0–1)
Eosinophils Absolute: 0 10*3/uL (ref 0.0–0.7)
Eosinophils Relative: 0 % (ref 0–5)
HCT: 50.4 % (ref 39.0–52.0)
Hemoglobin: 17 g/dL (ref 13.0–17.0)
LYMPHS ABS: 2.1 10*3/uL (ref 0.7–4.0)
Lymphocytes Relative: 9 % — ABNORMAL LOW (ref 12–46)
MCH: 30.2 pg (ref 26.0–34.0)
MCHC: 33.7 g/dL (ref 30.0–36.0)
MCV: 89.5 fL (ref 78.0–100.0)
MONOS PCT: 11 % (ref 3–12)
Monocytes Absolute: 2.5 10*3/uL — ABNORMAL HIGH (ref 0.1–1.0)
NEUTROS ABS: 17.9 10*3/uL — AB (ref 1.7–7.7)
Neutrophils Relative %: 80 % — ABNORMAL HIGH (ref 43–77)
Platelets: ADEQUATE 10*3/uL (ref 150–400)
RBC: 5.63 MIL/uL (ref 4.22–5.81)
RDW: 16.5 % — ABNORMAL HIGH (ref 11.5–15.5)
WBC: 22.5 10*3/uL — ABNORMAL HIGH (ref 4.0–10.5)

## 2015-05-28 LAB — URINE MICROSCOPIC-ADD ON

## 2015-05-28 LAB — I-STAT CG4 LACTIC ACID, ED: LACTIC ACID, VENOUS: 7.77 mmol/L — AB (ref 0.5–2.0)

## 2015-05-28 LAB — URINALYSIS, ROUTINE W REFLEX MICROSCOPIC
GLUCOSE, UA: NEGATIVE mg/dL
NITRITE: NEGATIVE
PROTEIN: 30 mg/dL — AB
Specific Gravity, Urine: 1.025 (ref 1.005–1.030)
UROBILINOGEN UA: 0.2 mg/dL (ref 0.0–1.0)
pH: 5.5 (ref 5.0–8.0)

## 2015-05-28 LAB — I-STAT TROPONIN, ED: TROPONIN I, POC: 0.44 ng/mL — AB (ref 0.00–0.08)

## 2015-05-28 MED ORDER — SODIUM CHLORIDE 0.9 % IV SOLN
INTRAVENOUS | Status: DC
  Administered 2015-05-28: 21:00:00 via INTRAVENOUS

## 2015-05-28 MED ORDER — HYDROCHLOROTHIAZIDE 25 MG PO TABS
ORAL_TABLET | ORAL | Status: AC
  Administered 2015-05-28: 25 mg
  Filled 2015-05-28: qty 1

## 2015-05-28 MED ORDER — HYDRALAZINE HCL 25 MG PO TABS
25.0000 mg | ORAL_TABLET | Freq: Four times a day (QID) | ORAL | Status: DC | PRN
  Filled 2015-05-28: qty 1

## 2015-05-28 MED ORDER — SODIUM CHLORIDE 0.9 % IV BOLUS (SEPSIS)
500.0000 mL | Freq: Once | INTRAVENOUS | Status: AC
  Administered 2015-05-28: 500 mL via INTRAVENOUS

## 2015-05-28 NOTE — H&P (Addendum)
PCP:   Lanette Hampshire, MD   Chief Complaint:  Lethargy  HPI: 81 year old Jerry [name] who   has a past medical history of Cancer; Prostate cancer; Alzheimer's dementia; Stroke; FTT (failure to thrive) in adult; and Somnolence (12/2014). Patient sent from skilled facility for lethargy. Patient is unable to provide any history, no family at bedside. History obtained from the ED records. Patient was sent from the skilled facility for poor by mouth intake for past several days. And has been feeling extremely weak. Patient has a history of dementia, though is alert but not able to provide any significant history. In the ED patient found to have profound hyponatremia with sodium 180, BUN 70, creatinine 2.2, potassium 3.5,  135 no chemistry results in the Epic. The hard copy was given to the ED physician  Allergies:  No Known Allergies    Past Medical History  Diagnosis Date  . Cancer   . Prostate cancer   . Alzheimer's dementia   . Stroke   . FTT (failure to thrive) in adult   . Somnolence 12/2014    due to dehydration    Past Surgical History  Procedure Laterality Date  . Hip pinning,cannulated  10/18/2012    Procedure: CANNULATED HIP PINNING;  Surgeon: Sanjuana Kava, MD;  Location: [facility];  Service: Orthopedics;  Laterality: Left;  Asnis Left Hip Pinning     Prior to Admission medications   Medication Sig Start Date End Date Taking? Authorizing Provider  polyethylene glycol (MIRALAX / GLYCOLAX) packet Take 17 g by mouth daily. 01/15/15  Yes Donne Hazel, MD    Social History:  reports that he has been smoking Cigarettes.  He has a 38 pack-year smoking history. He does not have any smokeless tobacco history on file. He reports that he drinks alcohol. He reports that he does not use illicit drugs.  Family history unobtainable due to patient's dementia   Review of Systems:  Unobtainable due to patient's underlying dementia   Physical Exam: Blood pressure 186/92, pulse 119,  temperature 98.9 F (37.2 C), temperature source Rectal, resp. rate 13, SpO2 98 %. Constitutional:   Patient is a malnourished appearing Jerry [name] in no acute distress and cooperative with exam. Head: Normocephalic and atraumatic Mouth: Mucus membranes dry Neck: Supple, No Thyromegaly Cardiovascular: RRR, S1 normal, S2 normal Pulmonary/Chest: CTAB, no wheezes, rales, or rhonchi Abdominal: Soft. Non-tender, non-distended, bowel sounds are normal, no masses, organomegaly, or guarding present.  Neurological: Alert but not oriented 3,  moving all extremities Extremities : No Cyanosis, Clubbing or Edema   CBC:  Recent Labs Lab 05/28/15 1642  WBC 22.5*  NEUTROABS 17.9*  HGB 17.0  HCT 50.4  MCV 89.5  PLT PLATELET CLUMPS NOTED ON SMEAR, COUNT APPEARS ADEQUATE    Radiological Exams on Admission: Dg Chest Port 1 View  05/28/2015   CLINICAL DATA:  Week, not eating  EXAM: PORTABLE CHEST - 1 VIEW  COMPARISON:  Chest x-ray of 01/17/2015  FINDINGS: The lungs are clear and somewhat hyperaerated. No infiltrate or effusion is seen. Mediastinal and hilar contours are unremarkable. The heart is within normal limits in size. No bony abnormality is seen.  IMPRESSION: Hyper aeration.  No active lung disease.   Electronically Signed   By: Ivar Drape M.D.   On: 05/28/2015 16:50    EKG: Independently reviewed. Sinus tachycardia   Assessment/Plan Active Problems:   Leukocytosis   Hypertension   Hypernatremia   ARF (acute renal failure)  Hypernatremia Patient has significant elevation  of sodium, 180 from dehydration and poor by mouth intake As 6.5 L of water deficit Will repeat BMP now and also in a.m. Start D5W at 100 mL per hour  Hypertensive urgency Patient has significant elevation of blood pressure Will monitor the patient in stepdown unit Start metoprolol 50 mg twice a day Start hydralazine 25 minute grams by mouth every 6 hours when necessary for BP greater than 160/100.  Acute kidney  injury Likely from dehydration and poor by mouth intake Continue IV fluids follow BMP in a.m.  Leukocytosis Likely from dehydration, no signs and symptoms of infection. UA shows hematuria but negative nitrite and small leukocytes.  Code status: Full code, not confirmed with skilled facility. Please confirm with the skilled facility in a.m.  Family discussion: No family at bedside  Time Spent on Admission: 60 min  [place] Pager: [phone] 05/28/2015, 7:51 PM  If 7PM-7AM, please contact night-coverage  [email or web address]  Password [email or web address]

## 2015-05-28 NOTE — ED Notes (Signed)
Pt from nursing home, per staff pt since yesterday pt not eating and extremely weak.

## 2015-05-28 NOTE — ED Notes (Signed)
Pt can not hold himself up in wheelchair in triage, tech taking pt to hallway bed

## 2015-05-28 NOTE — ED Notes (Signed)
Pt given 25 mg of hydrochlorothiazide instead of 25 mg of hydralazine by mistake. Thayer Headings in ICU noticed this at 2150. Chare RN Ruthy Dick made aware by this nurse Natividad Brood, RN. Dr. Darrick Meigs aware at 2004. Orders for a  STAT BMP ordered and were drawn at 2007. This nurse made aware of pt's critical labs that were better than his criticals from earlier in the day at 2040 in the lab while dropping off a urine specimen. 2250, Dr. Darrick Meigs made aware. Dr. Darrick Meigs said "OK, that's fine." he told me it was ok to give pt the 25 mg of hydralazine but med not available in pyxis. Thayer Headings, RN made aware in ICU and will administer upstairs.

## 2015-05-28 NOTE — ED Notes (Signed)
MD at bedside. 

## 2015-05-28 NOTE — ED Provider Notes (Signed)
CSN: [id number]     Arrival date & time 05/28/15  1535 History   First MD Initiated Contact with Patient 05/28/15 1620     Chief Complaint  Patient presents with  . Fatigue      The history is provided by a relative, a caregiver and the nursing home. The history is limited by the condition of the patient (Hx dementia).  Pt was seen at 1620. Per NH staff, pt's caregiver and family: State pt has had gradual FTT for the past several days. Pt has not taken PO since yesterday and been "extremely weak."  Pt himself has hx of dementia and pulls the covers over his head when spoken too.   Full Code per son [facility]) Past Medical History  Diagnosis Date  . Cancer   . Prostate cancer   . Alzheimer's dementia   . Stroke   . FTT (failure to thrive) in adult   . Somnolence 12/2014    due to dehydration   Past Surgical History  Procedure Laterality Date  . Hip pinning,cannulated  10/18/2012    Procedure: CANNULATED HIP PINNING;  Surgeon: Sanjuana Kava, MD;  Location: [facility];  Service: Orthopedics;  Laterality: Left;  Asnis Left Hip Pinning     History  Substance Use Topics  . Smoking status: Current Every Day Smoker -- 0.50 packs/day for 76 years    Types: Cigarettes  . Smokeless tobacco: Not on file  . Alcohol Use: Yes     Comment: occasional    Review of Systems  Unable to perform ROS: Dementia      Allergies  Review of patient's allergies indicates no known allergies.  Home Medications   Prior to Admission medications   Medication Sig Start Date End Date Taking? Authorizing Provider  polyethylene glycol (MIRALAX / GLYCOLAX) packet Take 17 g by mouth daily. 01/15/15   Donne Hazel, MD   BP 170/105 mmHg  Temp(Src) 98.9 F (37.2 C) (Rectal)  Resp 26  SpO2 100%   Filed Vitals:   05/28/15 1705 05/28/15 1706 05/28/15 1715 05/28/15 1730  BP:    172/104  Pulse:   98   Temp: 98.9 F (37.2 C)     TempSrc: Rectal     Resp:   26   SpO2:  100% 100%      Physical Exam   1625: Physical examination:  Nursing notes reviewed; Vital signs and O2 SAT reviewed;  Constitutional: Thin, frail. In no acute distress; Head:  Normocephalic, atraumatic; Eyes: EOMI, PERRL, No scleral icterus; ENMT: Mouth and pharynx normal, Mucous membranes dry; Neck: Supple, Full range of motion, No lymphadenopathy; Cardiovascular: Tachycardic rate and rhythm, No gallop; Respiratory: Breath sounds clear & equal bilaterally, No wheezes. Normal respiratory effort/excursion; Chest: Nontender, Movement normal; Abdomen: Soft, Nontender, Nondistended, Normal bowel sounds; Genitourinary: No CVA tenderness; Extremities: Pulses normal, No tenderness, No edema, No calf edema or asymmetry.; Neuro: Awake, alert. No facial droop. Minimal speech per baseline. Moves all extremities spontaneously on stretcher.; Skin: Color normal, Warm, Dry.   ED Course  Procedures      EKG Interpretation   Date/Time:  Wednesday May 28 2015 16:34:50 EDT Ventricular Rate:  117 PR Interval:  115 QRS Duration: 99 QT Interval:  339 QTC Calculation: 473 R Axis:   -81 Text Interpretation:  Sinus tachycardia Consider right atrial enlargement  Left anterior fascicular block LVH with secondary repolarization  abnormality Anterior Q waves, possibly due to LVH ST depression, probably  rate related Baseline wander When compared  with ECG of 01/17/2015 Rate  faster and Nonspecific ST and T wave abnormality is now Present Confirmed  by [facility]  MD, Nunzio Cory [phone]) on 05/28/2015 4:38:43 PM      MDM  MDM Reviewed: previous chart, nursing note and vitals Reviewed previous: labs and ECG Interpretation: labs, ECG and x-ray Total time providing critical care: 30-74 minutes. This excludes time spent performing separately reportable procedures and services. Consults: admitting MD   CRITICAL CARE Performed by: Alfonzo Feller Total critical care time: 35 Critical care time was exclusive of separately billable procedures and  treating other patients. Critical care was necessary to treat or prevent imminent or life-threatening deterioration. Critical care was time spent personally by me on the following activities: development of treatment plan with patient and/or surrogate as well as nursing, discussions with consultants, evaluation of patient's response to treatment, examination of patient, obtaining history from patient or surrogate, ordering and performing treatments and interventions, ordering and review of laboratory studies, ordering and review of radiographic studies, pulse oximetry and re-evaluation of patient's condition.  Results for orders placed or performed during the hospital encounter of 05/28/15  CBC with Differential  Result Value Ref Range   WBC 22.5 (H) 4.0 - 10.5 K/uL   RBC 5.63 4.22 - 5.81 MIL/uL   Hemoglobin 17.0 13.0 - 17.0 g/dL   HCT 50.4 39.0 - 52.0 %   MCV 89.5 78.0 - 100.0 fL   MCH 30.2 26.0 - 34.0 pg   MCHC 33.7 30.0 - 36.0 g/dL   RDW 16.5 (H) 11.5 - 15.5 %   Platelets  150 - 400 K/uL    PLATELET CLUMPS NOTED ON SMEAR, COUNT APPEARS ADEQUATE   Neutrophils Relative % 80 (H) 43 - 77 %   Neutro Abs 17.9 (H) 1.7 - 7.7 K/uL   Lymphocytes Relative 9 (L) 12 - 46 %   Lymphs Abs 2.1 0.7 - 4.0 K/uL   Monocytes Relative 11 3 - 12 %   Monocytes Absolute 2.5 (H) 0.1 - 1.0 K/uL   Eosinophils Relative 0 0 - 5 %   Eosinophils Absolute 0.0 0.0 - 0.7 K/uL   Basophils Relative 0 0 - 1 %   Basophils Absolute 0.0 0.0 - 0.1 K/uL   WBC Morphology ATYPICAL LYMPHOCYTES   Urinalysis, Routine w reflex microscopic (not at [facility])  Result Value Ref Range   Color, Urine YELLOW YELLOW   APPearance HAZY (A) CLEAR   Specific Gravity, Urine 1.025 1.005 - 1.030   pH 5.5 5.0 - 8.0   Glucose, UA NEGATIVE NEGATIVE mg/dL   Hgb urine dipstick LARGE (A) NEGATIVE   Bilirubin Urine SMALL (A) NEGATIVE   Ketones, ur TRACE (A) NEGATIVE mg/dL   Protein, ur 30 (A) NEGATIVE mg/dL   Urobilinogen, UA 0.2 0.0 - 1.0 mg/dL    Nitrite NEGATIVE NEGATIVE   Leukocytes, UA SMALL (A) NEGATIVE  Urine microscopic-add on  Result Value Ref Range   Squamous Epithelial / LPF RARE RARE   WBC, UA 3-6 <3 WBC/hpf   RBC / HPF TOO NUMEROUS TO COUNT <3 RBC/hpf   Bacteria, UA FEW (A) RARE  I-Stat CG4 Lactic Acid, ED  Result Value Ref Range   Lactic Acid, Venous 7.77 (HH) 0.5 - 2.0 mmol/L   Comment NOTIFIED PHYSICIAN   I-stat troponin, ED  Result Value Ref Range   Troponin i, poc 0.44 (HH) 0.00 - 0.08 ng/mL   Comment NOTIFIED PHYSICIAN    Comment 3  Dg Chest Port 1 View 05/28/2015   CLINICAL DATA:  Week, not eating  EXAM: PORTABLE CHEST - 1 VIEW  COMPARISON:  Chest x-ray of 01/17/2015  FINDINGS: The lungs are clear and somewhat hyperaerated. No infiltrate or effusion is seen. Mediastinal and hilar contours are unremarkable. The heart is within normal limits in size. No bony abnormality is seen.  IMPRESSION: Hyper aeration.  No active lung disease.   Electronically Signed   By: Ivar Drape M.D.   On: 05/28/2015 16:50    I-stat chem: Na 180, Cl >135, K 3.5, iCa 1.35, TCO2 20, Glu 88, BUN 70, Cr 2.2, Hb 16.7, Hct 49   1850:  Pt awake/alert, hiding under the covers when ED staff comes in the room to talk with him. Very active on stretcher picking at bedclothes, monitor leads, etc. IVF continues.  Afebrile via rectal temp. T/C to [facility] Dr. Marily Memos, case discussed, including:  HPI, pertinent PM/SHx, VS/PE, dx testing, ED course and treatment:  Agreeable to admit, requests to write temporary orders, obtain tele bed to Dr. Everette Rank' service.    Francine Graven, DO 05/30/15 [phone]

## 2015-05-28 NOTE — ED Notes (Signed)
Pt has multiple bruises and reddened areas all over his body.

## 2015-05-28 NOTE — ED Notes (Signed)
i-stat chem 8 did not cross over to epic, hard copy given to EDP.

## 2015-05-28 NOTE — ED Notes (Signed)
Pt resting in bed.

## 2015-05-29 ENCOUNTER — Inpatient Hospital Stay (HOSPITAL_COMMUNITY)

## 2015-05-29 LAB — COMPREHENSIVE METABOLIC PANEL
ALBUMIN: 3.5 g/dL (ref 3.5–5.0)
ALT: 49 U/L (ref 17–63)
AST: 83 U/L — ABNORMAL HIGH (ref 15–41)
Alkaline Phosphatase: 82 U/L (ref 38–126)
BUN: 85 mg/dL — ABNORMAL HIGH (ref 6–20)
CO2: 23 mmol/L (ref 22–32)
Calcium: 9.8 mg/dL (ref 8.9–10.3)
Creatinine, Ser: 2.19 mg/dL — ABNORMAL HIGH (ref 0.61–1.24)
GFR calc non Af Amer: 24 mL/min — ABNORMAL LOW (ref >60)
GFR, EST AFRICAN AMERICAN: 28 mL/min — AB (ref >60)
GLUCOSE: 121 mg/dL — AB (ref 65–99)
Potassium: 3.7 mmol/L (ref 3.5–5.1)
Sodium: 172 mmol/L (ref 135–145)
TOTAL PROTEIN: 6.5 g/dL (ref 6.5–8.1)
Total Bilirubin: 1.1 mg/dL (ref 0.3–1.2)

## 2015-05-29 LAB — CBC
HCT: 47.2 % (ref 39.0–52.0)
Hemoglobin: 15.4 g/dL (ref 13.0–17.0)
MCH: 28.8 pg (ref 26.0–34.0)
MCHC: 32.6 g/dL (ref 30.0–36.0)
MCV: 88.4 fL (ref 78.0–100.0)
Platelets: 121 10*3/uL — ABNORMAL LOW (ref 150–400)
RBC: 5.34 MIL/uL (ref 4.22–5.81)
RDW: 17.7 % — ABNORMAL HIGH (ref 11.5–15.5)
WBC: 22.2 10*3/uL — AB (ref 4.0–10.5)

## 2015-05-29 LAB — MRSA PCR SCREENING: MRSA BY PCR: NEGATIVE

## 2015-05-29 MED ORDER — ACETAMINOPHEN 650 MG RE SUPP
650.0000 mg | Freq: Four times a day (QID) | RECTAL | Status: DC | PRN
  Administered 2015-05-31: 650 mg via RECTAL
  Filled 2015-05-29: qty 1

## 2015-05-29 MED ORDER — METOPROLOL SUCCINATE ER 50 MG PO TB24
50.0000 mg | ORAL_TABLET | Freq: Two times a day (BID) | ORAL | Status: DC
  Administered 2015-05-29 – 2015-05-30 (×3): 50 mg via ORAL
  Filled 2015-05-29 (×3): qty 1

## 2015-05-29 MED ORDER — SODIUM CHLORIDE 0.9 % IV SOLN
INTRAVENOUS | Status: AC
Start: 2015-05-29 — End: 2015-05-30

## 2015-05-29 MED ORDER — HYDRALAZINE HCL 25 MG PO TABS
25.0000 mg | ORAL_TABLET | Freq: Four times a day (QID) | ORAL | Status: DC | PRN
  Administered 2015-05-29: 25 mg via ORAL
  Filled 2015-05-29: qty 1

## 2015-05-29 MED ORDER — ENOXAPARIN SODIUM 30 MG/0.3ML ~~LOC~~ SOLN
30.0000 mg | SUBCUTANEOUS | Status: DC
  Administered 2015-05-30 – 2015-06-04 (×6): 30 mg via SUBCUTANEOUS
  Filled 2015-05-29 (×6): qty 0.3

## 2015-05-29 MED ORDER — ENSURE ENLIVE PO LIQD
237.0000 mL | Freq: Two times a day (BID) | ORAL | Status: DC
  Administered 2015-05-29 – 2015-05-30 (×2): 237 mL via ORAL

## 2015-05-29 MED ORDER — ONDANSETRON HCL 4 MG/2ML IJ SOLN
4.0000 mg | Freq: Four times a day (QID) | INTRAMUSCULAR | Status: DC | PRN
  Administered 2015-05-29: 4 mg via INTRAVENOUS
  Filled 2015-05-29: qty 2

## 2015-05-29 MED ORDER — ENOXAPARIN SODIUM 40 MG/0.4ML ~~LOC~~ SOLN
40.0000 mg | SUBCUTANEOUS | Status: DC
  Administered 2015-05-29: 40 mg via SUBCUTANEOUS
  Filled 2015-05-29: qty 0.4

## 2015-05-29 MED ORDER — ONDANSETRON HCL 4 MG PO TABS: 4.0000 mg | ORAL_TABLET | Freq: Four times a day (QID) | ORAL | Status: DC | PRN

## 2015-05-29 MED ORDER — CHLORHEXIDINE GLUCONATE 0.12 % MT SOLN
15.0000 mL | Freq: Two times a day (BID) | OROMUCOSAL | Status: DC
Start: 2015-05-29 — End: 2015-06-01
  Administered 2015-05-29 – 2015-05-30 (×3): 15 mL via OROMUCOSAL
  Filled 2015-05-29 (×4): qty 15

## 2015-05-29 MED ORDER — ACETAMINOPHEN 325 MG PO TABS
650.0000 mg | ORAL_TABLET | Freq: Four times a day (QID) | ORAL | Status: DC | PRN
  Administered 2015-05-29: 650 mg via ORAL
  Filled 2015-05-29: qty 2

## 2015-05-29 MED ORDER — CETYLPYRIDINIUM CHLORIDE 0.05 % MT LIQD
7.0000 mL | Freq: Two times a day (BID) | OROMUCOSAL | Status: DC
  Administered 2015-05-29 – 2015-06-03 (×7): 7 mL via OROMUCOSAL

## 2015-05-29 MED ORDER — DEXTROSE-NACL 5-0.45 % IV SOLN
INTRAVENOUS | Status: DC
  Administered 2015-05-29 – 2015-05-30 (×3): via INTRAVENOUS

## 2015-05-29 MED ORDER — PRO-STAT SUGAR FREE PO LIQD
30.0000 mL | Freq: Two times a day (BID) | ORAL | Status: DC
  Administered 2015-05-29 – 2015-05-31 (×5): 30 mL via ORAL
  Filled 2015-05-29 (×5): qty 30

## 2015-05-29 MED ORDER — DEXTROSE 5 % IV SOLN
INTRAVENOUS | Status: DC
  Administered 2015-05-29: 01:00:00 via INTRAVENOUS

## 2015-05-29 NOTE — Care Management Note (Signed)
Case Management Note  Patient Details  Name: [name] MRN: [id number] Date of Birth: 1916-04-20  Expected Discharge Date:                  Expected Discharge Plan:  Assisted Living / Rest Home  In-House Referral:  Clinical Social Work  Discharge planning Services  CM Consult  Post Acute Care Choice:  NA Choice offered to:  NA  DME Arranged:    DME Agency:     HH Arranged:    [place] Agency:     Status of Service:  Completed, signed off  Medicare Important Message Given:    Date Medicare IM Given:    Medicare IM give by:    Date Additional Medicare IM Given:    Additional Medicare Important Message give by:     If discussed at [place] of Stay Meetings, dates discussed:    Additional Comments: Pt is form [facility]. Pt admitted with acute renal failure. Pt uses a walker at baseline and requires assistance with ADL's. Pt plans to return to [facility] at discharge. CSW is aware and will arrange for return to facility. No CM needs are anticipated.  Sherald Barge, RN 05/29/2015, 2:52 PM

## 2015-05-29 NOTE — Consult Note (Signed)
Reason for Consult: Acute kidney injury and hypernatremia Referring Physician: Dr. Ramond Dial is an 81 y.o. male.  HPI: He is a patient who has history of prostate cancer, severe dementia, stroke presently was brought from skilled nursing home because of lethargy, poor by mouth intake and weakness. When he was evaluated patient was found to have significant hypernatremia and acute kidney injury is admitted to the hospital. Presently unable to get any additional information. Most of the information is obtained from his chart.  Past Medical History  Diagnosis Date  . Cancer   . Prostate cancer   . Alzheimer's dementia   . Stroke   . FTT (failure to thrive) in adult   . Somnolence 12/2014    due to dehydration    Past Surgical History  Procedure Laterality Date  . Hip pinning,cannulated  10/18/2012    Procedure: CANNULATED HIP PINNING;  Surgeon: Sanjuana Kava, MD;  Location: [facility];  Service: Orthopedics;  Laterality: Left;  Asnis Left Hip Pinning     No family history on file.  Social History:  reports that he has been smoking Cigarettes.  He has a 38 pack-year smoking history. He does not have any smokeless tobacco history on file. He reports that he drinks alcohol. He reports that he does not use illicit drugs.  Allergies: No Known Allergies  Medications: I have reviewed the patient's current medications.  Results for orders placed or performed during the hospital encounter of 05/28/15 (from the past 48 hour(s))  Urinalysis, Routine w reflex microscopic (not at [facility])     Status: Abnormal   Collection Time: 05/28/15  4:40 PM  Result Value Ref Range   Color, Urine YELLOW YELLOW   APPearance HAZY (A) CLEAR   Specific Gravity, Urine 1.025 1.005 - 1.030   pH 5.5 5.0 - 8.0   Glucose, UA NEGATIVE NEGATIVE mg/dL   Hgb urine dipstick LARGE (A) NEGATIVE   Bilirubin Urine SMALL (A) NEGATIVE   Ketones, ur TRACE (A) NEGATIVE mg/dL   Protein, ur 30 (A) NEGATIVE mg/dL    Urobilinogen, UA 0.2 0.0 - 1.0 mg/dL   Nitrite NEGATIVE NEGATIVE   Leukocytes, UA SMALL (A) NEGATIVE  Urine microscopic-add on     Status: Abnormal   Collection Time: 05/28/15  4:40 PM  Result Value Ref Range   Squamous Epithelial / LPF RARE RARE   WBC, UA 3-6 <3 WBC/hpf   RBC / HPF TOO NUMEROUS TO COUNT <3 RBC/hpf   Bacteria, UA FEW (A) RARE  CBC with Differential     Status: Abnormal   Collection Time: 05/28/15  4:42 PM  Result Value Ref Range   WBC 22.5 (H) 4.0 - 10.5 K/uL   RBC 5.63 4.22 - 5.81 MIL/uL   Hemoglobin 17.0 13.0 - 17.0 g/dL   HCT 50.4 39.0 - 52.0 %   MCV 89.5 78.0 - 100.0 fL   MCH 30.2 26.0 - 34.0 pg   MCHC 33.7 30.0 - 36.0 g/dL   RDW 16.5 (H) 11.5 - 15.5 %   Platelets  150 - 400 K/uL    PLATELET CLUMPS NOTED ON SMEAR, COUNT APPEARS ADEQUATE    Comment: PLATELET CLUMPING, SUGGEST RECOLLECTION OF SAMPLE IN CITRATE TUBE. SPECIMEN CHECKED FOR CLOTS    Neutrophils Relative % 80 (H) 43 - 77 %   Neutro Abs 17.9 (H) 1.7 - 7.7 K/uL   Lymphocytes Relative 9 (L) 12 - 46 %   Lymphs Abs 2.1 0.7 - 4.0 K/uL  Monocytes Relative 11 3 - 12 %   Monocytes Absolute 2.5 (H) 0.1 - 1.0 K/uL   Eosinophils Relative 0 0 - 5 %   Eosinophils Absolute 0.0 0.0 - 0.7 K/uL   Basophils Relative 0 0 - 1 %   Basophils Absolute 0.0 0.0 - 0.1 K/uL   WBC Morphology ATYPICAL LYMPHOCYTES   I-Stat CG4 Lactic Acid, ED     Status: Abnormal   Collection Time: 05/28/15  5:03 PM  Result Value Ref Range   Lactic Acid, Venous 7.77 (HH) 0.5 - 2.0 mmol/L   Comment NOTIFIED PHYSICIAN   I-stat troponin, ED     Status: Abnormal   Collection Time: 05/28/15  5:06 PM  Result Value Ref Range   Troponin i, poc 0.44 (HH) 0.00 - 0.08 ng/mL   Comment NOTIFIED PHYSICIAN    Comment 3            Comment: Due to the release kinetics of cTnI, a negative result within the first hours of the onset of symptoms does not rule out myocardial infarction with certainty. If myocardial infarction is still  suspected, repeat the test at appropriate intervals.   Basic metabolic panel     Status: Abnormal   Collection Time: 05/28/15 10:10 PM  Result Value Ref Range   Sodium 178 (HH) 135 - 145 mmol/L    Comment: CRITICAL RESULT CALLED TO, READ BACK BY AND VERIFIED WITH: HUTCHENS,L ON 05/28/15 AT 2240 BY LOY,C    Potassium 3.6 3.5 - 5.1 mmol/L   Chloride >130 (HH) 101 - 111 mmol/L    Comment: CRITICAL RESULT CALLED TO, READ BACK BY AND VERIFIED WITH: HUTCHENS,L ON 05/28/15 AT 2240 BY LOY,C    CO2 22 22 - 32 mmol/L   Glucose, Bld 82 65 - 99 mg/dL   BUN 86 (H) 6 - 20 mg/dL   Creatinine, Ser 2.23 (H) 0.61 - 1.24 mg/dL   Calcium 10.0 8.9 - 10.3 mg/dL   GFR calc non Af Amer 23 (L) >60 mL/min   GFR calc Af Amer 27 (L) >60 mL/min    Comment: (NOTE) The eGFR has been calculated using the CKD EPI equation. This calculation has not been validated in all clinical situations. eGFR's persistently <60 mL/min signify possible Chronic Kidney Disease.    Anion gap NOT CALCULATED 5 - 15  MRSA PCR Screening     Status: None   Collection Time: 05/28/15 11:52 PM  Result Value Ref Range   MRSA by PCR NEGATIVE NEGATIVE    Comment:        The GeneXpert MRSA Assay (FDA approved for NASAL specimens only), is one component of a comprehensive MRSA colonization surveillance program. It is not intended to diagnose MRSA infection nor to guide or monitor treatment for MRSA infections.   Comprehensive metabolic panel     Status: Abnormal (Preliminary result)   Collection Time: 05/29/15  4:04 AM  Result Value Ref Range   Sodium 172 (HH) 135 - 145 mmol/L    Comment: CRITICAL RESULT CALLED TO, READ BACK BY AND VERIFIED WITH: HEARN,J AT 5:45AM ON 05/29/15 BY FESTERMAN,C    Potassium 3.7 3.5 - 5.1 mmol/L   Chloride >130 (HH) 101 - 111 mmol/L    Comment: CRITICAL RESULT CALLED TO, READ BACK BY AND VERIFIED WITH: HEARN,J AT 5:45AM ON 05/29/15 BY FESTERMAN,C    CO2 23 22 - 32 mmol/L   Glucose, Bld 121 (H) 65  - 99 mg/dL   BUN 85 (H) 6 - 20  mg/dL   Creatinine, Ser 2.19 (H) 0.61 - 1.24 mg/dL   Calcium 9.8 8.9 - 10.3 mg/dL   Total Protein 6.5 6.5 - 8.1 g/dL   Albumin 3.5 3.5 - 5.0 g/dL   AST 83 (H) 15 - 41 U/L   ALT 49 17 - 63 U/L   Alkaline Phosphatase 82 38 - 126 U/L   Total Bilirubin 1.1 0.3 - 1.2 mg/dL   GFR calc non Af Amer 24 (L) >60 mL/min   GFR calc Af Amer 28 (L) >60 mL/min    Comment: (NOTE) The eGFR has been calculated using the CKD EPI equation. This calculation has not been validated in all clinical situations. eGFR's persistently <60 mL/min signify possible Chronic Kidney Disease.    Anion gap PENDING 5 - 15  CBC     Status: Abnormal   Collection Time: 05/29/15  4:04 AM  Result Value Ref Range   WBC 22.2 (H) 4.0 - 10.5 K/uL   RBC 5.34 4.22 - 5.81 MIL/uL   Hemoglobin 15.4 13.0 - 17.0 g/dL   HCT 47.2 39.0 - 52.0 %   MCV 88.4 78.0 - 100.0 fL   MCH 28.8 26.0 - 34.0 pg   MCHC 32.6 30.0 - 36.0 g/dL   RDW 17.7 (H) 11.5 - 15.5 %   Platelets 121 (L) 150 - 400 K/uL    Dg Chest Port 1 View  05/28/2015   CLINICAL DATA:  Week, not eating  EXAM: PORTABLE CHEST - 1 VIEW  COMPARISON:  Chest x-ray of 01/17/2015  FINDINGS: The lungs are clear and somewhat hyperaerated. No infiltrate or effusion is seen. Mediastinal and hilar contours are unremarkable. The heart is within normal limits in size. No bony abnormality is seen.  IMPRESSION: Hyper aeration.  No active lung disease.   Electronically Signed   By: Ivar Drape M.D.   On: 05/28/2015 16:50    Review of Systems  Unable to perform ROS  Blood pressure 164/80, pulse 93, temperature 97.5 F (36.4 C), temperature source Axillary, resp. rate 16, height 5' 2"  (1.575 m), weight 95 lb 10.9 oz (43.4 kg), SpO2 100 %. Physical Exam  Constitutional: No distress.  Eyes: Left eye exhibits no discharge.  Neck: No JVD present.  Cardiovascular: Normal rate and regular rhythm.   No murmur heard. Respiratory: No respiratory distress. He has no  wheezes.  GI: He exhibits no distension.  Musculoskeletal: He exhibits no tenderness.  Neurological:  Patient awake, disoriented, he wants to go to bed, trying to follow his hand cover. Presently he doesn't answer any questions.    Assessment/Plan: Problem #1 acute kidney injury: His creatinine was 0.94 on 70/28/2016 with EGFR of 68 cc per minute possibly related decline. Presently his BUN and creatinine is very high but improving. This could be secondary to prerenal syndrome as patient has significant hypernatremia and also high urine specific gravity. Patient presently is nonoliguric. Problem #2 hypernatremia: Possibly from lack of free water. Patient has been treated with normal saline and presently on D5 water. His sodium is improving. Problem #3 altered mental status: Possibly from hypernatremia. Presently patient is awake but disoriented. Problem #5 hypertension: His blood pressure fluctuates. Patient is presently restless. Problem #6 history of dementia Problem #7 prostate cancer Plan: 1] We'll change his IV fluid to D5 half-normal saline at 125 mL per hour 2] we'll check his basic metabolic panel in the morning.  Javanna Patin S 05/29/2015, 9:15 AM

## 2015-05-29 NOTE — Progress Notes (Signed)
Subjective: The patient is mentally confused. He does have a history of Alzheimer dementia previous CVA and failure to thrive as an adult. He was sent from skilled facility because of poor oral intake for several days. It was noted he had markedly elevated serum sodium on admission was subsequently admitted. Does have a history of prostate cancer [place] I  Objective: Vital signs in last 24 hours: Temp:  [97.5 F (36.4 C)-98.2 F (36.8 C)] 97.5 F (36.4 C) (07/28 0833) Pulse Rate:  [73-151] 83 (07/28 1700) Resp:  [13-36] 19 (07/28 1700) BP: (129-205)/(66-192) 129/66 mmHg (07/28 1700) SpO2:  [77 %-100 %] 93 % (07/28 1700) Weight:  [43.4 kg (95 lb 10.9 oz)] 43.4 kg (95 lb 10.9 oz) (07/28 0433) Weight change:  Last BM Date: 05/29/15  Intake/Output from previous day: 07/27 0701 - 07/28 0700 In: 1548.3 [I.V.:1548.3] Out: 150 [Urine:150] Intake/Output this shift: Total I/O In: 0  Out: 675 [Urine:675]  Physical Exam: Gen. appearance make the patient is mentally confused HEENT negative  Neck supple no JVD or thyroid abnormalities  Heart regular rhythm no murmurs  Lungs clear to P&A  Abdomen no palpable organs or masses  Neurological no sensory or motor abnormalities    Recent Labs  05/28/15 1642 05/29/15 0404  WBC 22.5* 22.2*  HGB 17.0 15.4  HCT 50.4 47.2  PLT PLATELET CLUMPS NOTED ON SMEAR, COUNT APPEARS ADEQUATE 121*   BMET  Recent Labs  05/28/15 2210 05/29/15 0404  NA 178* 172*  K 3.6 3.7  CL >130* >130*  CO2 22 23  GLUCOSE 82 121*  BUN 86* 85*  CREATININE 2.23* 2.19*  CALCIUM 10.0 9.8    Studies/Results: Dg Chest Port 1 View  05/28/2015   CLINICAL DATA:  Week, not eating  EXAM: PORTABLE CHEST - 1 VIEW  COMPARISON:  Chest x-ray of 01/17/2015  FINDINGS: The lungs are clear and somewhat hyperaerated. No infiltrate or effusion is seen. Mediastinal and hilar contours are unremarkable. The heart is within normal limits in size. No bony abnormality is seen.   IMPRESSION: Hyper aeration.  No active lung disease.   Electronically Signed   By: Ivar Drape M.D.   On: 05/28/2015 16:50    Medications:  . sodium chloride   Intravenous STAT  . antiseptic oral rinse  7 mL Mouth Rinse q12n4p  . chlorhexidine  15 mL Mouth Rinse BID  . [START ON 05/30/2015] enoxaparin (LOVENOX) injection  30 mg Subcutaneous Q24H  . feeding supplement (ENSURE ENLIVE)  237 mL Oral BID BM  . feeding supplement (PRO-STAT SUGAR FREE 64)  30 mL Oral BID  . metoprolol succinate  50 mg Oral BID WC    . dextrose 5 % and 0.45% NaCl 125 mL/hr at 05/29/15 1735     Assessment/Plan: 1. Dementia Alzheimer type  II. Acute kidney injury with elevated serum sodium-plan to continue D5 half-normal saline continue to monitor electrolytes I will obtain nephrology consult  3. Hypertension plan to continue metoprolol 50 mg daily-hydralazine 25 mg every 6 hours as needed for elevated blood pressure   LOS: 1 day   Nikitas Davtyan G 05/29/2015, 5:47 PM

## 2015-05-29 NOTE — Progress Notes (Signed)
Pt became very agitated and vomited a large amount of " brown stool like appearance" emesis. Dr. Legrand Rams made aware, and ordered KUB stat.

## 2015-05-29 NOTE — Clinical Social Work Note (Signed)
Clinical Social Work Assessment  Patient Details  Name: Jerry [name] MRN: [id number] Date of Birth: [birth year]  Date of referral:  05/29/15               Reason for consult:  Facility Placement                Permission sought to share information with:    Permission granted to share information::     Name::        Agency::     Relationship::     Contact Information:     Housing/Transportation Living arrangements for the past 2 months:  Reform of Information:  [place], Other (Comment Required) Jerry [name], daughter-in-law) Patient Interpreter Needed:  None Criminal Activity/Legal Involvement Pertinent to Current Situation/Hospitalization:  No - Comment as needed Significant Relationships:  Adult Children, Other Family Members Lives with:  Facility Resident Do you feel safe going back to the place where you live?  Yes Need for family participation in patient care:  Yes (Comment)  Care giving concerns: Facility resident.   Social Worker assessment / plan:  CSW met with patient whose son, Jerry [name], and daughter in law, Jerry [name], were at bedside.  Mrs. Jerry [name] advised that patient has been at [facility] for the past five months.  She advised that patient uses a walker to go to the bathroom but he typically just lies on the couch and watches TV.  She advised that facility staff take him outside some time to smoke.  Mrs. Jerry [name] advised that prior going to the facility, he resided with them for six years.  [name] advised that the family wanted patient to return to the facility at discharge.  He advised that they visit with him frequently.  CSW spoke with Jerry [name] who confirmed family's statements.  He stated that staff assists patient with all ADLs.  He advised that patient can come back to the facility.   Employment status:  Retired Nurse, adult PT Recommendations:  Not assessed at this  time Information / Referral to community resources:     Patient/Family's Response to care:  Family is agreeable for patient to return to [facility] [facility].   Patient/Family's Understanding of and Emotional Response to Diagnosis, Current Treatment, and Prognosis:  Family understands that due to patint's diagnosis, current treatment and prognosis that he will most appropriately be cared for in an ALF at this time.   Emotional Assessment Appearance:  Developmentally appropriate Attitude/Demeanor/Rapport:  Unable to Assess Affect (typically observed):  Calm Orientation:    Alcohol / Substance use:  Tobacco Use Psych involvement (Current and /or in the community):  No (Comment)  Discharge Needs  Concerns to be addressed:  Discharge Planning Concerns Readmission within the last 30 days:  No Current discharge risk:  None Barriers to Discharge:  No Barriers Identified   Jerry Gully, LCSW 05/29/2015, 11:56 AM [phone]

## 2015-05-29 NOTE — Progress Notes (Signed)
Initial Nutrition Assessment  DOCUMENTATION CODES:   Severe malnutrition in context of chronic illness, Underweight  INTERVENTION:  Ensure Enlive po BID, each supplement provides 350 kcal and 20 grams of protein   ProStat 30 ml TID (each 30 ml provides 100 kcal, 15 gr protein)   Feeding assistance and encouragement for meals and supplements required  NUTRITION DIAGNOSIS:   Inadequate oral intake related to chronic illness as evidenced by severe depletion of body fat, severe depletion of muscle mass.  GOAL:   Other (Comment) (Pt will meet needs as able unless family is planning to pursre more agrressive nutirition interventions.)   MONITOR:   PO intake, Supplement acceptance, Skin, Labs, Weight trends  REASON FOR ASSESSMENT:   Malnutrition Screening Tool    ASSESSMENT: Pt presents from ALF with adult failure to thrive, poor po intake, increased weakness, significant hypernatremia and acute kidney injury. Hx of prostate cancer and advanced dementia. He has mitts on his hands today. Pt unable to provide hx. Diet: Breakfast tray is here and only the apple juice has been consumed. At lunch 0% intake documented.   Nutrition focused exam: multiple depletions of muscle and fat mass    Labs: sodium 172, BUN - 85  Cr. 2.19, glucose 121 mg/dl  Diet Order:  Diet 2 gram sodium Room service appropriate?: Yes; Fluid consistency:: Thin  Skin:   WDL  Last BM:   7/28  Height:   Ht Readings from Last 1 Encounters:  05/29/15 5\' 2"  (1.575 m)    Weight:   Wt Readings from Last 1 Encounters:  05/29/15 95 lb 10.9 oz (43.4 kg)    Ideal Body Weight:  53.6 kg  BMI:  Body mass index is 17.5 kg/(m^2).   Estimated Nutritional Needs:   Kcal:  1505-1720  Protein:  50-60 gr  Fluid:  1300 ml daily  EDUCATION NEEDS:   Education needs no appropriate at this time  Colman Cater MS,RD,CSG,LDN Office: [phone] Pager: [phone]

## 2015-05-30 ENCOUNTER — Inpatient Hospital Stay (HOSPITAL_COMMUNITY)

## 2015-05-30 ENCOUNTER — Inpatient Hospital Stay (HOSPITAL_COMMUNITY): Admitting: Anesthesiology

## 2015-05-30 LAB — BLOOD GAS, ARTERIAL
Acid-base deficit: 1.9 mmol/L (ref 0.0–2.0)
Bicarbonate: 23.2 mEq/L (ref 20.0–24.0)
FIO2: 100
O2 SAT: 99.6 %
PEEP/CPAP: 5 cmH2O
PH ART: 7.352 (ref 7.350–7.450)
PO2 ART: 428 mmHg — AB (ref 80.0–100.0)
RATE: 15 resp/min
TCO2: 20.4 mmol/L (ref 0–100)
VT: 440 mL
pCO2 arterial: 42.9 mmHg (ref 35.0–45.0)

## 2015-05-30 LAB — BASIC METABOLIC PANEL
Anion gap: 7 (ref 5–15)
BUN: 67 mg/dL — ABNORMAL HIGH (ref 6–20)
CALCIUM: 9.3 mg/dL (ref 8.9–10.3)
CO2: 25 mmol/L (ref 22–32)
Chloride: 136 mmol/L (ref 101–111)
Creatinine, Ser: 1.77 mg/dL — ABNORMAL HIGH (ref 0.61–1.24)
GFR calc non Af Amer: 31 mL/min — ABNORMAL LOW (ref >60)
GFR, EST AFRICAN AMERICAN: 36 mL/min — AB (ref >60)
GLUCOSE: 71 mg/dL (ref 65–99)
POTASSIUM: 3.3 mmol/L — AB (ref 3.5–5.1)
Sodium: 168 mmol/L (ref 135–145)

## 2015-05-30 MED ORDER — ALBUTEROL SULFATE (2.5 MG/3ML) 0.083% IN NEBU
INHALATION_SOLUTION | RESPIRATORY_TRACT | Status: AC
  Administered 2015-05-30: 2.5 mg via RESPIRATORY_TRACT
  Filled 2015-05-30: qty 3

## 2015-05-30 MED ORDER — PANTOPRAZOLE SODIUM 40 MG IV SOLR
40.0000 mg | INTRAVENOUS | Status: DC
  Administered 2015-05-30 – 2015-06-03 (×5): 40 mg via INTRAVENOUS
  Filled 2015-05-30 (×5): qty 40

## 2015-05-30 MED ORDER — ALBUTEROL SULFATE (2.5 MG/3ML) 0.083% IN NEBU: 2.5000 mg | INHALATION_SOLUTION | Freq: Four times a day (QID) | RESPIRATORY_TRACT | Status: DC | PRN

## 2015-05-30 MED ORDER — MIDAZOLAM HCL 2 MG/2ML IJ SOLN
1.0000 mg | INTRAMUSCULAR | Status: DC | PRN
  Administered 2015-05-30 (×2): 1 mg via INTRAVENOUS
  Filled 2015-05-30 (×2): qty 2

## 2015-05-30 MED ORDER — SUCCINYLCHOLINE CHLORIDE 20 MG/ML IJ SOLN
INTRAMUSCULAR | Status: DC | PRN
  Administered 2015-05-30: 120 mg via INTRAVENOUS

## 2015-05-30 MED ORDER — ETOMIDATE 2 MG/ML IV SOLN
INTRAVENOUS | Status: AC
  Filled 2015-05-30: qty 20

## 2015-05-30 MED ORDER — SODIUM CHLORIDE 0.9 % IV SOLN
25.0000 ug/h | INTRAVENOUS | Status: DC
  Administered 2015-05-30 – 2015-06-02 (×4): 50 ug/h via INTRAVENOUS
  Administered 2015-06-02: 25 ug/h via INTRAVENOUS
  Filled 2015-05-30 (×4): qty 50

## 2015-05-30 MED ORDER — PIPERACILLIN-TAZOBACTAM IN DEX 2-0.25 GM/50ML IV SOLN
2.2500 g | Freq: Three times a day (TID) | INTRAVENOUS | Status: DC
  Filled 2015-05-30 (×10): qty 50

## 2015-05-30 MED ORDER — ETOMIDATE 2 MG/ML IV SOLN
INTRAVENOUS | Status: DC | PRN
  Administered 2015-05-30: 8 mg via INTRAVENOUS

## 2015-05-30 MED ORDER — POTASSIUM CL IN DEXTROSE 5% 20 MEQ/L IV SOLN
20.0000 meq | INTRAVENOUS | Status: DC
  Administered 2015-05-30 – 2015-05-31 (×4): 20 meq via INTRAVENOUS

## 2015-05-30 MED ORDER — SODIUM CHLORIDE 0.9 % IV SOLN
INTRAVENOUS | Status: DC | PRN
  Administered 2015-05-30: 13:00:00 via INTRAVENOUS

## 2015-05-30 MED ORDER — ROCURONIUM BROMIDE 50 MG/5ML IV SOLN
INTRAVENOUS | Status: AC
  Filled 2015-05-30: qty 2

## 2015-05-30 MED ORDER — ALBUTEROL SULFATE (2.5 MG/3ML) 0.083% IN NEBU
2.5000 mg | INHALATION_SOLUTION | Freq: Four times a day (QID) | RESPIRATORY_TRACT | Status: DC
  Administered 2015-05-30 – 2015-06-04 (×19): 2.5 mg via RESPIRATORY_TRACT
  Filled 2015-05-30 (×20): qty 3

## 2015-05-30 MED ORDER — PIPERACILLIN SOD-TAZOBACTAM SO 2.25 (2-0.25) G IV SOLR
2.2500 g | Freq: Three times a day (TID) | INTRAVENOUS | Status: DC
  Administered 2015-05-30 – 2015-06-01 (×6): 2.25 g via INTRAVENOUS
  Filled 2015-05-30 (×10): qty 2.25

## 2015-05-30 MED ORDER — CHLORHEXIDINE GLUCONATE 0.12 % MT SOLN
15.0000 mL | Freq: Two times a day (BID) | OROMUCOSAL | Status: DC
  Administered 2015-05-30 – 2015-06-04 (×11): 15 mL via OROMUCOSAL
  Filled 2015-05-30 (×10): qty 15

## 2015-05-30 MED ORDER — FENTANYL CITRATE (PF) 100 MCG/2ML IJ SOLN
50.0000 ug | INTRAMUSCULAR | Status: DC | PRN
  Administered 2015-05-30 (×2): 50 ug via INTRAVENOUS
  Filled 2015-05-30 (×2): qty 2

## 2015-05-30 MED ORDER — FENTANYL BOLUS VIA INFUSION
25.0000 ug | INTRAVENOUS | Status: DC | PRN
  Filled 2015-05-30: qty 25

## 2015-05-30 MED ORDER — FENTANYL CITRATE (PF) 100 MCG/2ML IJ SOLN: 50.0000 ug | Freq: Once | INTRAMUSCULAR | Status: DC

## 2015-05-30 MED ORDER — MIDAZOLAM HCL 2 MG/2ML IJ SOLN
1.0000 mg | INTRAMUSCULAR | Status: DC | PRN
  Administered 2015-05-30 – 2015-05-31 (×3): 1 mg via INTRAVENOUS
  Filled 2015-05-30 (×3): qty 2

## 2015-05-30 MED ORDER — FENTANYL CITRATE (PF) 100 MCG/2ML IJ SOLN
50.0000 ug | INTRAMUSCULAR | Status: DC | PRN
  Administered 2015-05-30 – 2015-05-31 (×2): 50 ug via INTRAVENOUS
  Filled 2015-05-30: qty 2

## 2015-05-30 MED ORDER — LIDOCAINE HCL (CARDIAC) 20 MG/ML IV SOLN
INTRAVENOUS | Status: AC
  Filled 2015-05-30: qty 5

## 2015-05-30 MED ORDER — CETYLPYRIDINIUM CHLORIDE 0.05 % MT LIQD
7.0000 mL | Freq: Four times a day (QID) | OROMUCOSAL | Status: DC
  Administered 2015-05-31 – 2015-06-04 (×15): 7 mL via OROMUCOSAL

## 2015-05-30 MED ORDER — SUCCINYLCHOLINE CHLORIDE 20 MG/ML IJ SOLN
INTRAMUSCULAR | Status: AC
  Filled 2015-05-30: qty 1

## 2015-05-30 NOTE — Care Management Important Message (Signed)
Important Message  Patient Details  Name: Jerry [name] MRN: [id number] Date of Birth: 19-Apr-1916   Medicare Important Message Given:  Yes-second notification given    Joylene Draft, RN 05/30/2015, 4:13 PM

## 2015-05-30 NOTE — Progress Notes (Signed)
eLink Physician-Brief Progress Note Patient Name: TYJON BOWEN DOB: [birth year] MRN: [id number]   Date of Service  05/30/2015  HPI/Events of Note  Best Practice  eICU Interventions  PPI for stress ulcer propy while intubated     Intervention Category Intermediate Interventions: Best-practice therapies (e.g. DVT, beta blocker, etc.)  DETERDING,ELIZABETH 05/30/2015, 9:42 PM

## 2015-05-30 NOTE — Progress Notes (Signed)
ANTIBIOTIC CONSULT NOTE - INITIAL  Pharmacy Consult for Zosyn Indication: aspiration pneumonia  No Known Allergies  Patient Measurements: Height: 5\' 2"  (157.5 cm) Weight: 96 lb 9 oz (43.8 kg) IBW/kg (Calculated) : 54.6 Adjusted Body Weight:   Vital Signs: Temp: 98.2 F (36.8 C) (07/29 0642) Temp Source: Oral (07/29 0642) BP: 122/53 mmHg (07/29 0900) Pulse Rate: 72 (07/29 0800) Intake/Output from previous day: 07/28 0701 - 07/29 0700 In: 100 [P.O.:100] Out: 1875 [Urine:1875] Intake/Output from this shift: Total I/O In: 300 [P.O.:200; I.V.:100] Out: -   Labs:  Recent Labs  05/28/15 1642 05/28/15 2210 05/29/15 0404 05/30/15 0412  WBC 22.5*  --  22.2*  --   HGB 17.0  --  15.4  --   PLT PLATELET CLUMPS NOTED ON SMEAR, COUNT APPEARS ADEQUATE  --  121*  --   CREATININE  --  2.23* 2.19* 1.77*   Estimated Creatinine Clearance: 15.1 mL/min (by C-G formula based on Cr of 1.77). No results for input(s): VANCOTROUGH, VANCOPEAK, VANCORANDOM, GENTTROUGH, GENTPEAK, GENTRANDOM, TOBRATROUGH, TOBRAPEAK, TOBRARND, AMIKACINPEAK, AMIKACINTROU, AMIKACIN in the last 72 hours.   Microbiology: Recent Results (from the past 720 hour(s))  Urine culture     Status: None (Preliminary result)   Collection Time: 05/28/15  4:40 PM  Result Value Ref Range Status   Specimen Description URINE, CATHETERIZED  Final   Special Requests NONE  Final   Culture   Final    NO GROWTH < 24 HOURS Performed at [facility]    Report Status PENDING  Incomplete  MRSA PCR Screening     Status: None   Collection Time: 05/28/15 11:52 PM  Result Value Ref Range Status   MRSA by PCR NEGATIVE NEGATIVE Final    Comment:        The GeneXpert MRSA Assay (FDA approved for NASAL specimens only), is one component of a comprehensive MRSA colonization surveillance program. It is not intended to diagnose MRSA infection nor to guide or monitor treatment for MRSA infections.     Medical History: Past  Medical History  Diagnosis Date  . Cancer   . Prostate cancer   . Alzheimer's dementia   . Stroke   . FTT (failure to thrive) in adult   . Somnolence 12/2014    due to dehydration    Medications:  Prescriptions prior to admission  Medication Sig Dispense Refill Last Dose  . polyethylene glycol (MIRALAX / GLYCOLAX) packet Take 17 g by mouth daily. 14 each 0 05/28/2015 at Unknown time   Assessment: HPI: This is a 81 year old who was in his usual state of poor health when he had witnessed aspiration while he was eating. He underwent suctioning with clearance of some food but continued to have cough congestion and respiratory distress. Acute respiratory failure from aspiration. Mechanical ventilation at this time. CrCl < 20 ml/min  Goal of Therapy:  Eradicate infection  Plan:  Zosyn 2.25 GM IV every 8 hours Monitor renal function Labs per protocol  Abner Greenspan, [place] Frieden Bennett 05/30/2015,12:58 PM

## 2015-05-30 NOTE — Progress Notes (Signed)
Subjective: The patient remains mentally confused he did have an episode of vomiting through the night. He does have Alzheimer dementia with previous CVA and failure to thrive as an adult he does have a history of prostate cancer he had markedly elevated serum sodium on admission  Objective: Vital signs in last 24 hours: Temp:  [97.4 F (36.3 C)-98.1 F (36.7 C)] 98.1 F (36.7 C) (07/29 0005) Pulse Rate:  [73-89] 84 (07/28 1935) Resp:  [17-26] 21 (07/28 2020) BP: (119-178)/(66-118) 119/76 mmHg (07/28 2020) SpO2:  [93 %-100 %] 99 % (07/28 1935) Weight change:  Last BM Date: 05/29/15  Intake/Output from previous day: 07/28 0701 - 07/29 0700 In: 100 [P.O.:100] Out: 675 [Urine:675] Intake/Output this shift:    Physical Exam: Gen. appearance patient is mentally confused  HEENT negative  Neck supple no JVD or thyroid abnormalities  Heart regular rhythm no murmurs  Lungs clear to P&A  Abdomen no palpable organs or masses  Neurological no central or motor abnormalities   Recent Labs  05/28/15 1642 05/29/15 0404  WBC 22.5* 22.2*  HGB 17.0 15.4  HCT 50.4 47.2  PLT PLATELET CLUMPS NOTED ON SMEAR, COUNT APPEARS ADEQUATE 121*   BMET  Recent Labs  05/28/15 2210 05/29/15 0404  NA 178* 172*  K 3.6 3.7  CL >130* >130*  CO2 22 23  GLUCOSE 82 121*  BUN 86* 85*  CREATININE 2.23* 2.19*  CALCIUM 10.0 9.8    Studies/Results: Dg Abd 1 View  05/29/2015   CLINICAL DATA:  Generalized abdominal pain.  EXAM: ABDOMEN - 1 VIEW  COMPARISON:  01/17/2015  FINDINGS: Gallstones are visible in the right upper quadrant. There are calcifications superimposed on the left kidney which may represent nephrolithiasis. No ureteral calculi are evident. Abdominal gas pattern is normal. Severe lumbar degenerative disc changes incidentally noted.  IMPRESSION: Cholelithiasis. Possible left nephrolithiasis. Normal bowel gas pattern.   Electronically Signed   By: Andreas Newport M.D.   On: 05/29/2015  19:12   Dg Chest Port 1 View  05/28/2015   CLINICAL DATA:  Week, not eating  EXAM: PORTABLE CHEST - 1 VIEW  COMPARISON:  Chest x-ray of 01/17/2015  FINDINGS: The lungs are clear and somewhat hyperaerated. No infiltrate or effusion is seen. Mediastinal and hilar contours are unremarkable. The heart is within normal limits in size. No bony abnormality is seen.  IMPRESSION: Hyper aeration.  No active lung disease.   Electronically Signed   By: Ivar Drape M.D.   On: 05/28/2015 16:50    Medications:  . antiseptic oral rinse  7 mL Mouth Rinse q12n4p  . chlorhexidine  15 mL Mouth Rinse BID  . enoxaparin (LOVENOX) injection  30 mg Subcutaneous Q24H  . feeding supplement (ENSURE ENLIVE)  237 mL Oral BID BM  . feeding supplement (PRO-STAT SUGAR FREE 64)  30 mL Oral BID  . metoprolol succinate  50 mg Oral BID WC    . dextrose 5 % and 0.45% NaCl 125 mL/hr at 05/30/15 0151     Assessment/Plan: 1 dementia Alzheimer type  Acute kidney injury with elevated sodium plan to continue D5 half-normal saline continue to monitor electrolytes  Hypertension plan to continue metoprolol 50 mg daily hydralazine 25 mg every 6 hours as needed for elevated blood pressure   LOS: 2 days   Deloyd Handy G 05/30/2015, 6:24 AM

## 2015-05-30 NOTE — Progress Notes (Signed)
Nutrition Note: Change in status   DOCUMENTATION CODES:   Severe malnutrition in context of chronic illness, Underweight  INTERVENTION:  Initiate Vital 1.2 @ 10 ml/hr via OGT and increase by 10 ml every  hours to goal rate of 25 ml/hr.   30 ml Prostat daily  Initial regimen will provide 820 kcal, 60 gr protein and  487 ml daily.  Monitor magnesium, potassium, and phosphorus daily for at least 3 days, MD to replete as needed, as pt is at risk for refeeding syndrome given his chronic malnutrition.  NUTRITION DIAGNOSIS:   Inadequate oral intake related to chronic illness as evidenced by severe depletion of body fat, severe depletion of muscle mass.  GOAL:  Provide < 1000 kcal/ day initially. If no significant refeeding then rate of advancement will be a reassessed.   MONITOR:  Vent status, labs, wt trends   REASON FOR ASSESSMENT:   Malnutrition Screening Tool    ASSESSMENT:  Patient is currently intubated on ventilator support. Endotracheal tube and OGT to wall suction-output noted.  MV: 13.7 L/min Temp (24hrs), Avg:97.8 F (36.6 C), Min:97.4 F (36.3 C), Max:98.2 F (36.8 C)   Labs: sodium 168, BUN - 67  Cr. 177, glucose 71 mg/dl  Diet Order:  Diet NPO time specified  Skin:   WDL  Last BM:   7/28  Height:   Ht Readings from Last 1 Encounters:  05/30/15 5\' 2"  (1.575 m)    Weight:   Wt Readings from Last 1 Encounters:  05/30/15 96 lb 9 oz (43.8 kg)    Ideal Body Weight:  53.6 kg  BMI:  Body mass index is 17.66 kg/(m^2).   Estimated Nutritional Needs (reassessed):   Kcal:  1311   Protein:  50-60 gr  Fluid:  1300 ml daily  EDUCATION NEEDS:   Education needs no appropriate at this time  Colman Cater MS,RD,CSG,LDN Office: [phone] Pager: [phone]

## 2015-05-30 NOTE — Consult Note (Signed)
Consult requested by: Dr. Everette Rank Consult requested for respiratory failure:  HPI: This is a 81 year [name] who was in his usual state of poor health when he had witnessed aspiration while he was eating. He underwent suctioning with clearance of some food but continued to have cough congestion and respiratory distress. After discussion with his family Dr. Everette Rank he was intubated. I have been consulted for ventilator management and management of hi acute respiratory failure. At baseline he has a history of Alzheimer's disease prostate cancer stroke failure to thrive.  Past Medical History  Diagnosis Date  . Cancer   . Prostate cancer   . Alzheimer's dementia   . Stroke   . FTT (failure to thrive) in adult   . Somnolence 12/2014    due to dehydration     No family history on file.   History   Social History  . Marital Status: Widowed    Spouse Name: N/A  . Number of Children: N/A  . Years of Education: N/A   Social History Main Topics  . Smoking status: Current Every Day Smoker -- 0.50 packs/day for 76 years    Types: Cigarettes  . Smokeless tobacco: Not on file  . Alcohol Use: Yes     Comment: occasional  . Drug Use: No  . Sexual Activity: No   Other Topics Concern  . None   Social History Narrative     ROS: Not obtainable    Objective: Vital signs in last 24 hours: Temp:  [97.4 F (36.3 C)-98.2 F (36.8 C)] 98.2 F (36.8 C) (07/29 9678) Pulse Rate:  [72-89] 72 (07/29 0800) Resp:  [17-26] 22 (07/29 0900) BP: (119-165)/(53-118) 122/53 mmHg (07/29 0900) SpO2:  [93 %-100 %] 100 % (07/29 0800) Weight:  [43.8 kg (96 lb 9 oz)] 43.8 kg (96 lb 9 oz) (07/29 0642) Weight change: 0.4 kg (14.1 oz) Last BM Date: 05/29/15  Intake/Output from previous day: 07/28 0701 - 07/29 0700 In: 100 [P.O.:100] Out: 1875 [Urine:1875]  PHYSICAL EXAM He is intubated and sedated. He is very thin. His pupils react. He is edentulous. Mucous membranes are dry. His neck is supple without  masses. His chest shows diminished breath sounds bilaterally but no wheezing or rhonchi. His heart is regular without gallop. His abdomen is soft. Extremities showed no edema. Central nervous system exam he's been given medications and I cannot really assess that  Lab Results: Basic Metabolic Panel:  Recent Labs  05/29/15 0404 05/30/15 0412  NA 172* 168*  K 3.7 3.3*  CL >130* 136*  CO2 23 25  GLUCOSE 121* 71  BUN 85* 67*  CREATININE 2.19* 1.77*  CALCIUM 9.8 9.3   Liver Function Tests:  Recent Labs  05/29/15 0404  AST 83*  ALT 49  ALKPHOS 82  BILITOT 1.1  PROT 6.5  ALBUMIN 3.5   No results for input(s): LIPASE, AMYLASE in the last 72 hours. No results for input(s): AMMONIA in the last 72 hours. CBC:  Recent Labs  05/28/15 1642 05/29/15 0404  WBC 22.5* 22.2*  NEUTROABS 17.9*  --   HGB 17.0 15.4  HCT 50.4 47.2  MCV 89.5 88.4  PLT PLATELET CLUMPS NOTED ON SMEAR, COUNT APPEARS ADEQUATE 121*   Cardiac Enzymes: No results for input(s): CKTOTAL, CKMB, CKMBINDEX, TROPONINI in the last 72 hours. BNP: No results for input(s): PROBNP in the last 72 hours. D-Dimer: No results for input(s): DDIMER in the last 72 hours. CBG: No results for input(s): GLUCAP in the last 72 hours.  Hemoglobin A1C: No results for input(s): HGBA1C in the last 72 hours. Fasting Lipid Panel: No results for input(s): CHOL, HDL, LDLCALC, TRIG, CHOLHDL, LDLDIRECT in the last 72 hours. Thyroid Function Tests: No results for input(s): TSH, T4TOTAL, FREET4, T3FREE, THYROIDAB in the last 72 hours. Anemia Panel: No results for input(s): VITAMINB12, FOLATE, FERRITIN, TIBC, IRON, RETICCTPCT in the last 72 hours. Coagulation: No results for input(s): LABPROT, INR in the last 72 hours. Urine Drug Screen: Drugs of Abuse     Component Value Date/Time   LABOPIA NONE DETECTED 01/20/2015 1900   COCAINSCRNUR NONE DETECTED 01/20/2015 1900   LABBENZ NONE DETECTED 01/20/2015 1900   AMPHETMU NONE DETECTED  01/20/2015 1900   THCU NONE DETECTED 01/20/2015 1900   LABBARB NONE DETECTED 01/20/2015 1900    Alcohol Level: No results for input(s): ETH in the last 72 hours. Urinalysis:  Recent Labs  05/28/15 1640  COLORURINE YELLOW  LABSPEC 1.025  PHURINE 5.5  GLUCOSEU NEGATIVE  HGBUR LARGE*  BILIRUBINUR SMALL*  KETONESUR TRACE*  PROTEINUR 30*  UROBILINOGEN 0.2  NITRITE NEGATIVE  LEUKOCYTESUR SMALL*   Misc. Labs:   ABGS: No results for input(s): PHART, PO2ART, TCO2, HCO3 in the last 72 hours.  Invalid input(s): PCO2   MICROBIOLOGY: Recent Results (from the past 240 hour(s))  Urine culture     Status: None (Preliminary result)   Collection Time: 05/28/15  4:40 PM  Result Value Ref Range Status   Specimen Description URINE, CATHETERIZED  Final   Special Requests NONE  Final   Culture   Final    NO GROWTH < 24 HOURS Performed at [facility]    Report Status PENDING  Incomplete  MRSA PCR Screening     Status: None   Collection Time: 05/28/15 11:Jerry PM  Result Value Ref Range Status   MRSA by PCR NEGATIVE NEGATIVE Final    Comment:        The GeneXpert MRSA Assay (FDA approved for NASAL specimens only), is one component of a comprehensive MRSA colonization surveillance program. It is not intended to diagnose MRSA infection nor to guide or monitor treatment for MRSA infections.     Studies/Results: Dg Abd 1 View  05/29/2015   CLINICAL DATA:  Generalized abdominal pain.  EXAM: ABDOMEN - 1 VIEW  COMPARISON:  01/17/2015  FINDINGS: Gallstones are visible in the right upper quadrant. There are calcifications superimposed on the left kidney which may represent nephrolithiasis. No ureteral calculi are evident. Abdominal gas pattern is normal. Severe lumbar degenerative disc changes incidentally noted.  IMPRESSION: Cholelithiasis. Possible left nephrolithiasis. Normal bowel gas pattern.   Electronically Signed   By: Andreas Newport M.D.   On: 05/29/2015 19:12   Dg  Chest Port 1 View  05/30/2015   CLINICAL DATA:  Aspiration.  EXAM: PORTABLE CHEST - 1 VIEW  COMPARISON:  May 28, 2015.  FINDINGS: The heart size and mediastinal contours are within normal limits. No pneumothorax or pleural effusion is noted. Minimal bibasilar subsegmental atelectasis is noted. Mild dextroscoliosis of thoracic spine.  IMPRESSION: Minimal bibasilar subsegmental atelectasis.   Electronically Signed   By: Marijo Conception, M.D.   On: 05/30/2015 12:20   Dg Chest Port 1 View  05/28/2015   CLINICAL DATA:  Week, not eating  EXAM: PORTABLE CHEST - 1 VIEW  COMPARISON:  Chest x-ray of 01/17/2015  FINDINGS: The lungs are clear and somewhat hyperaerated. No infiltrate or effusion is seen. Mediastinal and hilar contours are unremarkable. The heart is within normal  limits in size. No bony abnormality is seen.  IMPRESSION: Hyper aeration.  No active lung disease.   Electronically Signed   By: Ivar Drape M.D.   On: 05/28/2015 16:50    Medications:  Prior to Admission:  Prescriptions prior to admission  Medication Sig Dispense Refill Last Dose  . polyethylene glycol (MIRALAX / GLYCOLAX) packet Take 17 g by mouth daily. 14 each 0 05/28/2015 at Unknown time   Scheduled: . albuterol  2.5 mg Nebulization Q6H  . albuterol      . antiseptic oral rinse  7 mL Mouth Rinse q12n4p  . antiseptic oral rinse  7 mL Mouth Rinse QID  . chlorhexidine  15 mL Mouth Rinse BID  . chlorhexidine  15 mL Mouth Rinse BID  . enoxaparin (LOVENOX) injection  30 mg Subcutaneous Q24H  . etomidate      . feeding supplement (ENSURE ENLIVE)  237 mL Oral BID BM  . feeding supplement (PRO-STAT SUGAR FREE 64)  30 mL Oral BID  . lidocaine (cardiac) 100 mg/59ml      . metoprolol succinate  50 mg Oral BID WC  . rocuronium      . succinylcholine       Continuous: . dextrose 5 % with KCl 20 mEq / L 20 mEq (05/30/15 1000)  . dextrose 5 % and 0.45% NaCl 125 mL/hr at 05/30/15 0151   FWY:OVZCHYIFOYDXA **OR** acetaminophen, fentaNYL  (SUBLIMAZE) injection, fentaNYL (SUBLIMAZE) injection, hydrALAZINE, midazolam, midazolam, ondansetron **OR** ondansetron (ZOFRAN) IV  Assesment: He has acute respiratory failure from aspiration. He is on mechanical ventilation.  He has acute renal failure is being treated with fluid resuscitation. He is probably dehydrated. He has hypernatremia which is being treated and that seems to be improved somewhat.  At baseline he has dementia and if we can get him off the ventilator CODE STATUS  will need to be reevaluated with family Active Problems:   Leukocytosis   Hypertension   Hypernatremia   ARF (acute renal failure)    Plan: He is going to have mechanical ventilation. He is going to have nebulized bronchodilators. I will go ahead and put him on antibiotics because of the aspiration.    LOS: 2 days   Donat Humble L 05/30/2015, 12:49 PM

## 2015-05-30 NOTE — Progress Notes (Signed)
Pt was being fed by family member and became increasingly confused.  Pt had increased work of breathing, gurgling breath sounds, and decreased level of consciousness.  RT and MD notified of suspected aspiration.  CXR ordered. RT performed deep suction and removed what appeared to be mashed potatoes. MD came to floor and gave orders to intubate the pt. Anesthesia called.

## 2015-05-30 NOTE — Clinical Social Work Note (Signed)
CSW spoke with supervisor, Nathaniel Man, related to concerns from nurse and nursing director (related to patient's level of dehydration and bruising from what they believe are falls). CSW did advise nursing director that per reports from family and facility patient typically only ambulates to use the bathroom and that he mainly lies on the couch and watches television. Zack advised that a PT consult be placed for patient to identify if he is at baseline.  Per Zack if patient is at baseline he can go back to facility, if he is rehab appropriate, he can go to SNF.   Nahome Bublitz, Clydene Pugh, LCSW

## 2015-05-30 NOTE — Progress Notes (Signed)
PT Cancellation Note  Patient Details Name: KYM FENTER MRN: [id number] DOB: [birth year]   Cancelled Treatment:    Reason Eval/Treat Not Completed: Medical issues which prohibited therapy.  Pt has had a decline in status and is now being intubated.  Current PT orders will be d/c'd.  Please reorder whenever appropriate.   Demetrios Isaacs L 05/30/2015, 1:04 PM

## 2015-05-30 NOTE — Progress Notes (Signed)
CRITICAL VALUE ALERT  Critical value received:  Na 168       Chloride 136  Date of notification:  05/30/2015  Time of notification:  0650  Critical value read back:Yes.    Nurse who received alert:  Miquel Dunn   MD notified (1st page):  McInnis  Time of first page:  [phone]  MD notified (2nd page):  Time of second page:  Responding MD:    Time MD responded:

## 2015-05-30 NOTE — Care Management Note (Signed)
Case Management Note  Patient Details  Name: Jerry [name] MRN: [id number] Date of Birth: 02-Jun-1916  Subjective/Objective:                    Action/Plan:   Expected Discharge Date:                  Expected Discharge Plan:  Assisted Living / Rest Home  In-House Referral:  Clinical Social Work  Discharge planning Services  CM Consult  Post Acute Care Choice:  NA Choice offered to:  NA  DME Arranged:    DME Agency:     HH Arranged:    [place] Agency:     Status of Service:  Completed, signed off  Medicare Important Message Given:    Date Medicare IM Given:    Medicare IM give by:    Date Additional Medicare IM Given:    Additional Medicare Important Message give by:     If discussed at [place] of Stay Meetings, dates discussed:    Additional Comments: Pt just intubated due to respiratory distress. Will continue to monitor. Christinia Gully [place], RN 05/30/2015, 1:00 PM

## 2015-05-30 NOTE — Anesthesia Procedure Notes (Signed)
Procedure Name: Intubation Date/Time: 05/30/2015 12:40 PM Performed by: Charmaine Downs Pre-anesthesia Checklist: Patient identified, Emergency Drugs available, Suction available and Patient being monitored Patient Re-evaluated:Patient Re-evaluated prior to inductionOxygen Delivery Method: Ambu bag Preoxygenation: Pre-oxygenation with 100% oxygen Intubation Type: IV induction, Rapid sequence and Cricoid Pressure applied Laryngoscope Size: Mac and 3 Grade View: Grade II Tube type: Subglottic suction tube Tube size: 7.5 mm Number of attempts: 1 Airway Equipment and Method: Stylet Placement Confirmation: ETT inserted through vocal cords under direct vision,  positive ETCO2,  CO2 detector and breath sounds checked- equal and bilateral Secured at: 24 cm Tube secured with: Tape Dental Injury: Teeth and Oropharynx as per pre-operative assessment

## 2015-05-30 NOTE — Progress Notes (Signed)
Endotracheal tube and gastric tube in good position by portable chest x-ray. No definite infiltrate but I will go ahead and start antibiotics

## 2015-05-30 NOTE — Progress Notes (Signed)
Subjective: Patient offers no complaints.   Objective: Vital signs in last 24 hours: Temp:  [97.4 F (36.3 C)-98.2 F (36.8 C)] 98.2 F (36.8 C) (07/29 0642) Pulse Rate:  [73-89] 86 (07/29 0754) Resp:  [17-26] 21 (07/28 2020) BP: (119-164)/(59-118) 128/59 mmHg (07/29 0754) SpO2:  [93 %-100 %] 99 % (07/28 1935) Weight:  [96 lb 9 oz (43.8 kg)] 96 lb 9 oz (43.8 kg) (07/29 0642)  Intake/Output from previous day: 07/28 0701 - 07/29 0700 In: 100 [P.O.:100] Out: 1875 [Urine:1875] Intake/Output this shift:     Recent Labs  05/28/15 1642 05/29/15 0404  HGB 17.0 15.4    Recent Labs  05/28/15 1642 05/29/15 0404  WBC 22.5* 22.2*  RBC 5.63 5.34  HCT 50.4 47.2  PLT PLATELET CLUMPS NOTED ON SMEAR, COUNT APPEARS ADEQUATE 121*    Recent Labs  05/29/15 0404 05/30/15 0412  NA 172* 168*  K 3.7 3.3*  CL >130* 136*  CO2 23 25  BUN 85* 67*  CREATININE 2.19* 1.77*  GLUCOSE 121* 71  CALCIUM 9.8 9.3   No results for input(s): LABPT, INR in the last 72 hours.  Patient is alert today. He is asking where he is still remains confused. Chest is clear to auscultation Heart exam regular rate and rhythm no murmur Extremities no edema   Assessment/Plan: Problem #1 hypernatremia: His sodium is 168 progressively improving. Possibly from lack of free water. Problem #2 acute kidney injury: His BUN and creatinine has improved. Problem #3 hypokalemia: His potassium has declined. Problem #4 altered mental status: Possibly combination of hypernatremia and dementia. Presently seems to be getting better Problem #5 hypertension Problem #6 UTI Problem # 7 dementia Plan: 1] DC half normal saline 2] will start patient on D5 water with 20 mEq of KCl at 125 mL per hour 3] will check his basic metabolic panel in the morning.   Elis Sauber S 05/30/2015, 8:12 AM

## 2015-05-31 ENCOUNTER — Inpatient Hospital Stay (HOSPITAL_COMMUNITY)

## 2015-05-31 LAB — BLOOD GAS, ARTERIAL
Acid-Base Excess: 1.2 mmol/L (ref 0.0–2.0)
Bicarbonate: 23.1 mEq/L (ref 20.0–24.0)
DRAWN BY: 213101
FIO2: 40
LHR: 15 {breaths}/min
MECHVT: 440 mL
O2 Saturation: 96.7 %
PCO2 ART: 40 mmHg (ref 35.0–45.0)
PEEP/CPAP: 5 cmH2O
PH ART: 7.381 (ref 7.350–7.450)
PO2 ART: 89.4 mmHg (ref 80.0–100.0)
TCO2: 20.7 mmol/L (ref 0–100)

## 2015-05-31 LAB — BASIC METABOLIC PANEL
ANION GAP: 12 (ref 5–15)
BUN: 45 mg/dL — AB (ref 6–20)
CHLORIDE: 124 mmol/L — AB (ref 101–111)
CO2: 25 mmol/L (ref 22–32)
CREATININE: 1.62 mg/dL — AB (ref 0.61–1.24)
Calcium: 8.9 mg/dL (ref 8.9–10.3)
GFR calc Af Amer: 40 mL/min — ABNORMAL LOW (ref >60)
GFR, EST NON AFRICAN AMERICAN: 34 mL/min — AB (ref >60)
Glucose, Bld: 127 mg/dL — ABNORMAL HIGH (ref 65–99)
POTASSIUM: 3.3 mmol/L — AB (ref 3.5–5.1)
Sodium: 161 mmol/L (ref 135–145)

## 2015-05-31 LAB — GLUCOSE, CAPILLARY
GLUCOSE-CAPILLARY: 90 mg/dL (ref 65–99)
Glucose-Capillary: 112 mg/dL — ABNORMAL HIGH (ref 65–99)
Glucose-Capillary: 67 mg/dL (ref 65–99)
Glucose-Capillary: 95 mg/dL (ref 65–99)

## 2015-05-31 LAB — URINE CULTURE: Culture: 20000

## 2015-05-31 MED ORDER — PRO-STAT SUGAR FREE PO LIQD
30.0000 mL | Freq: Every day | ORAL | Status: DC
Start: 2015-05-31 — End: 2015-06-04
  Administered 2015-06-01 – 2015-06-04 (×4): 30 mL
  Filled 2015-05-31 (×4): qty 30

## 2015-05-31 MED ORDER — DEXTROSE 50 % IV SOLN
INTRAVENOUS | Status: AC
  Filled 2015-05-31: qty 50

## 2015-05-31 MED ORDER — FENTANYL CITRATE (PF) 2500 MCG/50ML IJ SOLN
INTRAMUSCULAR | Status: AC
  Filled 2015-05-31: qty 50

## 2015-05-31 MED ORDER — VITAL AF 1.2 CAL PO LIQD
1000.0000 mL | ORAL | Status: DC
  Administered 2015-05-31 – 2015-06-02 (×3): 1000 mL
  Filled 2015-05-31 (×5): qty 1000

## 2015-05-31 MED ORDER — VITAL AF 1.2 CAL PO LIQD: 1000.0000 mL | ORAL | Status: DC

## 2015-05-31 MED ORDER — VITAL HIGH PROTEIN PO LIQD
1000.0000 mL | ORAL | Status: DC
  Filled 2015-05-31 (×3): qty 1000

## 2015-05-31 MED ORDER — GLUCOSE 40 % PO GEL
ORAL | Status: AC
  Administered 2015-05-31: 37.5 g
  Filled 2015-05-31: qty 1

## 2015-05-31 MED ORDER — ACETAMINOPHEN 160 MG/5ML PO SOLN
650.0000 mg | Freq: Four times a day (QID) | ORAL | Status: DC | PRN
  Administered 2015-06-01 – 2015-06-02 (×2): 650 mg
  Filled 2015-05-31 (×2): qty 20.3

## 2015-05-31 MED ORDER — METOPROLOL TARTRATE 50 MG PO TABS
50.0000 mg | ORAL_TABLET | Freq: Two times a day (BID) | ORAL | Status: DC
  Administered 2015-05-31 – 2015-06-04 (×8): 50 mg via ORAL
  Filled 2015-05-31 (×8): qty 1

## 2015-05-31 MED ORDER — POTASSIUM CL IN DEXTROSE 5% 20 MEQ/L IV SOLN
20.0000 meq | INTRAVENOUS | Status: DC
  Administered 2015-05-31 – 2015-06-01 (×2): 20 meq via INTRAVENOUS

## 2015-05-31 NOTE — Progress Notes (Signed)
Attempted insertion of PICC line unable to thread guidewire. Attempts were made on both arms. Recommend a central line or be sent to interventional radiology.

## 2015-05-31 NOTE — Progress Notes (Signed)
Pt has CBG of 67 despite receiving dextrose in IV fluids and being on tube feeding. Will give pt glucose gel per protocol.

## 2015-05-31 NOTE — Progress Notes (Signed)
Subjective: Patient aspirated yesterday and presently is intubated. Unable to take any additional information.  Objective: Vital signs in last 24 hours: Temp:  [97.8 F (36.6 C)-99.3 F (37.4 C)] 99.3 F (37.4 C) (07/30 0742) Pulse Rate:  [30-113] 101 (07/30 0800) Resp:  [14-32] 15 (07/30 0800) BP: (101-189)/(48-173) 105/59 mmHg (07/30 0800) SpO2:  [77 %-100 %] 100 % (07/30 0800) FiO2 (%):  [40 %-100 %] 40 % (07/30 0800) Weight:  [95 lb 7.4 oz (43.3 kg)] 95 lb 7.4 oz (43.3 kg) (07/30 0400)  Intake/Output from previous day: 07/29 0701 - 07/30 0700 In: 3043.5 [P.O.:200; I.V.:2663.5; NG/GT:30; IV Piggyback:150] Out: 1350 [Urine:950; Emesis/NG output:400] Intake/Output this shift: Total I/O In: 260 [I.V.:260] Out: -    Recent Labs  05/28/15 1642 05/29/15 0404  HGB 17.0 15.4    Recent Labs  05/28/15 1642 05/29/15 0404  WBC 22.5* 22.2*  RBC 5.63 5.34  HCT 50.4 47.2  PLT PLATELET CLUMPS NOTED ON SMEAR, COUNT APPEARS ADEQUATE 121*    Recent Labs  05/30/15 0412 05/31/15 0453  NA 168* 161*  K 3.3* 3.3*  CL 136* 124*  CO2 25 25  BUN 67* 45*  CREATININE 1.77* 1.62*  GLUCOSE 71 127*  CALCIUM 9.3 8.9   No results for input(s): LABPT, INR in the last 72 hours.  Patient intubated  Chest : Decreased breath sound have some inspiratory rhonchi. Heart exam regular rate and rhythm no murmur Extremities no edema   Assessment/Plan: Problem #1 hypernatremia: His sodium is 161 . Still high but progressively improving. Problem #2 acute kidney injury: His renal function is progressively recovering. Presently patient is non-oliguric. Problem #3 hypokalemia: His potassium is low but stable. Presently patient is on potassium supplement.. Problem #4 status post aspiration yesterday and presently intubated. Problem #5 hypertension Problem #6 UTI Problem # 7 dementia Plan:1] will increase his D5 water with 20 mEq of KCl at 135 mL per hour 3] will check his basic metabolic  panel in the morning.   Avanish Cerullo S 05/31/2015, 8:38 AM

## 2015-05-31 NOTE — Progress Notes (Signed)
Subjective: The patient remains intubated. He aspirated food yesterday developed respiratory distress. He does have Alzheimer disease and prostate cancer and has failed to thrive. Condition at present remained stable  Objective: Vital signs in last 24 hours: Temp:  [97.8 F (36.6 C)-99.3 F (37.4 C)] 99.3 F (37.4 C) (07/30 0742) Pulse Rate:  [30-113] 101 (07/30 0800) Resp:  [14-32] 15 (07/30 0800) BP: (101-189)/(48-173) 105/59 mmHg (07/30 0800) SpO2:  [10 %-100 %] 10 % (07/30 0852) FiO2 (%):  [40 %-100 %] 41 % (07/30 0852) Weight:  [43.3 kg (95 lb 7.4 oz)] 43.3 kg (95 lb 7.4 oz) (07/30 0400) Weight change: -0.5 kg (-1 lb 1.6 oz) Last BM Date: 05/30/15  Intake/Output from previous day: 07/29 0701 - 07/30 0700 In: 3043.5 [P.O.:200; I.V.:2663.5; NG/GT:30; IV Piggyback:150] Out: 1350 [Urine:950; Emesis/NG output:400] Intake/Output this shift: Total I/O In: 260 [I.V.:260] Out: -   Physical Exam: Gen. appearance patient is on respirator  Pupils react  Neck supple no masses  Lungs breath sounds heard bilaterally but diminished  Heart regular rhythm  Abdomen no palpable organs or masses   Recent Labs  05/28/15 1642 05/29/15 0404  WBC 22.5* 22.2*  HGB 17.0 15.4  HCT 50.4 47.2  PLT PLATELET CLUMPS NOTED ON SMEAR, COUNT APPEARS ADEQUATE 121*   BMET  Recent Labs  05/30/15 0412 05/31/15 0453  NA 168* 161*  K 3.3* 3.3*  CL 136* 124*  CO2 25 25  GLUCOSE 71 127*  BUN 67* 45*  CREATININE 1.77* 1.62*  CALCIUM 9.3 8.9    Studies/Results: Dg Abd 1 View  05/29/2015   CLINICAL DATA:  Generalized abdominal pain.  EXAM: ABDOMEN - 1 VIEW  COMPARISON:  01/17/2015  FINDINGS: Gallstones are visible in the right upper quadrant. There are calcifications superimposed on the left kidney which may represent nephrolithiasis. No ureteral calculi are evident. Abdominal gas pattern is normal. Severe lumbar degenerative disc changes incidentally noted.  IMPRESSION: Cholelithiasis.  Possible left nephrolithiasis. Normal bowel gas pattern.   Electronically Signed   By: Andreas Newport M.D.   On: 05/29/2015 19:12   Portable Chest Xray  05/31/2015   CLINICAL DATA:  Shortness of breath, aspiration  EXAM: PORTABLE CHEST - 1 VIEW  COMPARISON:  05/30/2015  FINDINGS: Endotracheal tube terminates 4 cm above the carina.  Mild bibasilar opacities, likely atelectasis. No focal consolidation. Mild blunting of the left costophrenic angle, possibly reflecting a small pleural effusion. No pneumothorax.  The heart is normal in size.  Enteric tube courses into the stomach.  IMPRESSION: Endotracheal tube terminates 4 cm above the carina.  Mild bibasilar opacities, likely atelectasis.   Electronically Signed   By: Julian Hy M.D.   On: 05/31/2015 08:22   Dg Chest Port 1 View  05/30/2015   CLINICAL DATA:  Aspiration.  EXAM: PORTABLE CHEST - 1 VIEW  COMPARISON:  May 28, 2015.  FINDINGS: The heart size and mediastinal contours are within normal limits. No pneumothorax or pleural effusion is noted. Minimal bibasilar subsegmental atelectasis is noted. Mild dextroscoliosis of thoracic spine.  IMPRESSION: Minimal bibasilar subsegmental atelectasis.   Electronically Signed   By: Marijo Conception, M.D.   On: 05/30/2015 12:20   Dg Chest Port 1v Same Day  05/30/2015   CLINICAL DATA:  ET tube placement  EXAM: PORTABLE CHEST - 1 VIEW SAME DAY  COMPARISON:  05/30/2015, 05/28/2015  FINDINGS: The lungs are hyperinflated likely secondary to COPD. There is an endotracheal tube with the tip 4 cm above the carina.  There is a nasogastric tube with the tip projecting over the stomach.  There is hazy right basilar airspace disease which may reflect atelectasis versus developing pneumonia. There is no pleural effusion or pneumothorax. The heart and mediastinal contours are unremarkable.  The osseous structures are unremarkable.  IMPRESSION: 1. Endotracheal tube with the tip 4 cm above the carina.   Electronically Signed    By: Kathreen Devoid   On: 05/30/2015 13:11    Medications:  . albuterol  2.5 mg Nebulization Q6H  . antiseptic oral rinse  7 mL Mouth Rinse q12n4p  . antiseptic oral rinse  7 mL Mouth Rinse QID  . chlorhexidine  15 mL Mouth Rinse BID  . chlorhexidine  15 mL Mouth Rinse BID  . enoxaparin (LOVENOX) injection  30 mg Subcutaneous Q24H  . feeding supplement (ENSURE ENLIVE)  237 mL Oral BID BM  . feeding supplement (PRO-STAT SUGAR FREE 64)  30 mL Oral BID  . fentaNYL (SUBLIMAZE) injection  50 mcg Intravenous Once  . metoprolol succinate  50 mg Oral BID WC  . pantoprazole (PROTONIX) IV  40 mg Intravenous Q24H  . piperacillin-tazobactam (ZOSYN)  IV  2.25 g Intravenous Q8H    . dextrose 5 % with KCl 20 mEq / L    . dextrose 5 % and 0.45% NaCl 125 mL/hr at 05/30/15 0151  . fentaNYL infusion INTRAVENOUS 50 mcg/hr (05/31/15 0800)     Assessment/Plan: 1 acute respiratory failure from aspiration-continue mechanical ventilation  2. Hypernatremia and renal failure to continue IV fluids hypo-low serum potassium will continue to follow  3. Dementia   LOS: 3 days   Sou Nohr G 05/31/2015, 9:03 AM

## 2015-05-31 NOTE — Progress Notes (Signed)
Nutrition Note: Follow Up  DOCUMENTATION CODES:  Severe malnutrition in context of chronic illness, Underweight  INTERVENTION:   Initiate Vital 1.2 @ 10 ml/hr via OGT and increase by 10 ml every  hours to goal rate of 25 ml/hr.   30 ml Prostat daily  Initial regimen will provide 820 kcal, 60 gr protein and  487 ml daily.  Monitor magnesium, potassium, and phosphorus daily for at least 3 days, MD to replete as needed, as pt is at risk for refeeding syndrome given his chronic malnutrition.  NUTRITION DIAGNOSIS:  Inadequate oral intake related to chronic illness as evidenced by severe depletion of body fat, severe depletion of muscle mass.  GOAL:  Provide < 1000 kcal/ day initially. If no significant refeeding then rate of advancement will be a reassessed.  MONITOR:  Vent status, labs, wt trends   REASON FOR ASSESSMENT:  Consult Enteral/tube feeding initiation and management  ASSESSMENT:  Patient is currently intubated on ventilator support. Endotracheal tube and OGT to wall suction-output noted.  Rd operating remotely today  MV: 10.3  L/min (7/29) Temp (24hrs), Avg:98.6 F (37 C), Min:97.8 F (36.6 C), Max:99.3 F (37.4 C)  (7/29) Labs: sodium 161, BUN - 45  Cr.1.62 Cl: 124  glucose 71 mg/dl, K 3.3  Diet Order:  Diet NPO time specified  Skin:   WDL  Last BM:   7/28  Height:  Ht Readings from Last 1 Encounters:  05/31/15 5\' 2"  (1.575 m)   Weight:  Wt Readings from Last 1 Encounters:  05/31/15 95 lb 7.4 oz (43.3 kg)    Ideal Body Weight:  53.6 kg  BMI:  Body mass index is 17.46 kg/(m^2).   Estimated Nutritional Needs (reassessed):  Kcal:  1311  Protein:  50-60 gr Fluid:  1300 ml daily  EDUCATION NEEDS:  Education needs no appropriate at this time  Burtis Junes RD, LDN Nutrition Pager: [phone] 05/31/2015 9:48 AM

## 2015-05-31 NOTE — Progress Notes (Signed)
Subjective: He remains intubated and on the ventilator. He has significant secretions so I don't think we have much opportunity to get him off  Objective: Vital signs in last 24 hours: Temp:  [97.8 F (36.6 C)-99.3 F (37.4 C)] 99.3 F (37.4 C) (07/30 0742) Pulse Rate:  [30-113] 101 (07/30 0800) Resp:  [14-32] 15 (07/30 0800) BP: (101-189)/(48-173) 105/59 mmHg (07/30 0800) SpO2:  [10 %-100 %] 10 % (07/30 0852) FiO2 (%):  [40 %-100 %] 40 % (07/30 0858) Weight:  [43.3 kg (95 lb 7.4 oz)] 43.3 kg (95 lb 7.4 oz) (07/30 0400) Weight change: -0.5 kg (-1 lb 1.6 oz) Last BM Date: 05/30/15  Intake/Output from previous day: 07/29 0701 - 07/30 0700 In: 3043.5 [P.O.:200; I.V.:2663.5; NG/GT:30; IV Piggyback:150] Out: 1350 [Urine:950; Emesis/NG output:400]  PHYSICAL EXAM General appearance: He is intubated and sedated and unresponsive. He is very thin Resp: rhonchi bilaterally Cardio: regular rate and rhythm, S1, S2 normal, no murmur, click, rub or gallop GI: soft, non-tender; bowel sounds normal; no masses,  no organomegaly Extremities: extremities normal, atraumatic, no cyanosis or edema  Lab Results:  Results for orders placed or performed during the hospital encounter of 05/28/15 (from the past 48 hour(s))  Basic metabolic panel     Status: Abnormal   Collection Time: 05/30/15  4:12 AM  Result Value Ref Range   Sodium 168 (HH) 135 - 145 mmol/L    Comment: RESULT REPEATED AND VERIFIED CRITICAL RESULT CALLED TO, READ BACK BY AND VERIFIED WITH: HEARN, J AT 0646 ON 05/30/2015 BY WOODS, M     Potassium 3.3 (L) 3.5 - 5.1 mmol/L   Chloride 136 (HH) 101 - 111 mmol/L    Comment: RESULT REPEATED AND VERIFIED CRITICAL RESULT CALLED TO, READ BACK BY AND VERIFIED WITH:  HEARN, J AT 0646 ON 05/30/2015 BY WOODS,M    CO2 25 22 - 32 mmol/L   Glucose, Bld 71 65 - 99 mg/dL   BUN 67 (H) 6 - 20 mg/dL   Creatinine, Ser 1.77 (H) 0.61 - 1.24 mg/dL   Calcium 9.3 8.9 - 10.3 mg/dL   GFR calc non Af  Amer 31 (L) >60 mL/min   GFR calc Af Amer 36 (L) >60 mL/min    Comment: (NOTE) The eGFR has been calculated using the CKD EPI equation. This calculation has not been validated in all clinical situations. eGFR's persistently <60 mL/min signify possible Chronic Kidney Disease.    Anion gap 7 5 - 15  Draw ABG 1 hour after initiation of ventilator     Status: Abnormal   Collection Time: 05/30/15  2:51 PM  Result Value Ref Range   FIO2 100.00    Delivery systems VENTILATOR    Mode PRESSURE REGULATED VOLUME CONTROL    VT 440 mL   LHR 15 resp/min   Peep/cpap 5.0 cm H20   pH, Arterial 7.352 7.350 - 7.450   pCO2 arterial 42.9 35.0 - 45.0 mmHg   pO2, Arterial 428 (H) 80.0 - 100.0 mmHg   Bicarbonate 23.2 20.0 - 24.0 mEq/L   TCO2 20.4 0 - 100 mmol/L   Acid-base deficit 1.9 0.0 - 2.0 mmol/L   O2 Saturation 99.6 %   Collection site LEFT RADIAL    Drawn by COLLECTED BY RT    Sample type ARTERIAL    Allens test (pass/fail) PASS PASS  Basic metabolic panel     Status: Abnormal   Collection Time: 05/31/15  4:53 AM  Result Value Ref Range   Sodium 161 (  HH) 135 - 145 mmol/L    Comment: CRITICAL RESULT CALLED TO, READ BACK BY AND VERIFIED WITH: NIELSON T. AT 0626A ON [id number] BY THOMPSON S.    Potassium 3.3 (L) 3.5 - 5.1 mmol/L   Chloride 124 (H) 101 - 111 mmol/L   CO2 25 22 - 32 mmol/L   Glucose, Bld 127 (H) 65 - 99 mg/dL   BUN 45 (H) 6 - 20 mg/dL   Creatinine, Ser 1.62 (H) 0.61 - 1.24 mg/dL   Calcium 8.9 8.9 - 10.3 mg/dL   GFR calc non Af Amer 34 (L) >60 mL/min   GFR calc Af Amer 40 (L) >60 mL/min    Comment: (NOTE) The eGFR has been calculated using the CKD EPI equation. This calculation has not been validated in all clinical situations. eGFR's persistently <60 mL/min signify possible Chronic Kidney Disease.    Anion gap 12 5 - 15  Blood gas, arterial     Status: None   Collection Time: 05/31/15  5:25 AM  Result Value Ref Range   FIO2 40.00    Delivery systems VENTILATOR    Mode  PRESSURE REGULATED VOLUME CONTROL    VT 440.0 mL   LHR 15.0 resp/min   Peep/cpap 5.0 cm H20   pH, Arterial 7.381 7.350 - 7.450   pCO2 arterial 40.0 35.0 - 45.0 mmHg   pO2, Arterial 89.4 80.0 - 100.0 mmHg   Bicarbonate 23.1 20.0 - 24.0 mEq/L   TCO2 20.7 0 - 100 mmol/L   Acid-Base Excess 1.2 0.0 - 2.0 mmol/L   O2 Saturation 96.7 %   Collection site LEFT RADIAL    Drawn by [id number]    Sample type ARTERIAL    Allens test (pass/fail) PASS PASS    ABGS  Recent Labs  05/31/15 0525  PHART 7.381  PO2ART 89.4  TCO2 20.7  HCO3 23.1   CULTURES Recent Results (from the past 240 hour(s))  Urine culture     Status: None   Collection Time: 05/28/15  4:40 PM  Result Value Ref Range Status   Specimen Description URINE, CATHETERIZED  Final   Special Requests NONE  Final   Culture   Final    20,000 COLONIES/mL STAPHYLOCOCCUS SPECIES (COAGULASE NEGATIVE) Performed at [facility]    Report Status 05/31/2015 FINAL  Final   Organism ID, Bacteria STAPHYLOCOCCUS SPECIES (COAGULASE NEGATIVE)  Final      Susceptibility   Staphylococcus species (coagulase negative) - MIC*    CIPROFLOXACIN <=0.5 SENSITIVE Sensitive     GENTAMICIN <=0.5 SENSITIVE Sensitive     NITROFURANTOIN <=16 SENSITIVE Sensitive     OXACILLIN <=0.25 SENSITIVE Sensitive     TETRACYCLINE 2 SENSITIVE Sensitive     VANCOMYCIN <=0.5 SENSITIVE Sensitive     TRIMETH/SULFA <=10 SENSITIVE Sensitive     CLINDAMYCIN 0.5 SENSITIVE Sensitive     RIFAMPIN <=0.5 SENSITIVE Sensitive     Inducible Clindamycin NEGATIVE Sensitive     * 20,000 COLONIES/mL STAPHYLOCOCCUS SPECIES (COAGULASE NEGATIVE)  MRSA PCR Screening     Status: None   Collection Time: 05/28/15 11:52 PM  Result Value Ref Range Status   MRSA by PCR NEGATIVE NEGATIVE Final    Comment:        The GeneXpert MRSA Assay (FDA approved for NASAL specimens only), is one component of a comprehensive MRSA colonization surveillance program. It is not intended to diagnose  MRSA infection nor to guide or monitor treatment for MRSA infections.    Studies/Results: Dg Abd 1 [employer]  05/29/2015   CLINICAL DATA:  Generalized abdominal pain.  EXAM: ABDOMEN - 1 VIEW  COMPARISON:  01/17/2015  FINDINGS: Gallstones are visible in the right upper quadrant. There are calcifications superimposed on the left kidney which may represent nephrolithiasis. No ureteral calculi are evident. Abdominal gas pattern is normal. Severe lumbar degenerative disc changes incidentally noted.  IMPRESSION: Cholelithiasis. Possible left nephrolithiasis. Normal bowel gas pattern.   Electronically Signed   By: Andreas Newport M.D.   On: 05/29/2015 19:12   Portable Chest Xray  05/31/2015   CLINICAL DATA:  Shortness of breath, aspiration  EXAM: PORTABLE CHEST - 1 VIEW  COMPARISON:  05/30/2015  FINDINGS: Endotracheal tube terminates 4 cm above the carina.  Mild bibasilar opacities, likely atelectasis. No focal consolidation. Mild blunting of the left costophrenic angle, possibly reflecting a small pleural effusion. No pneumothorax.  The heart is normal in size.  Enteric tube courses into the stomach.  IMPRESSION: Endotracheal tube terminates 4 cm above the carina.  Mild bibasilar opacities, likely atelectasis.   Electronically Signed   By: Julian Hy M.D.   On: 05/31/2015 08:22   Dg Chest Port 1 View  05/30/2015   CLINICAL DATA:  Aspiration.  EXAM: PORTABLE CHEST - 1 VIEW  COMPARISON:  May 28, 2015.  FINDINGS: The heart size and mediastinal contours are within normal limits. No pneumothorax or pleural effusion is noted. Minimal bibasilar subsegmental atelectasis is noted. Mild dextroscoliosis of thoracic spine.  IMPRESSION: Minimal bibasilar subsegmental atelectasis.   Electronically Signed   By: Marijo Conception, M.D.   On: 05/30/2015 12:20   Dg Chest Port 1v Same Day  05/30/2015   CLINICAL DATA:  ET tube placement  EXAM: PORTABLE CHEST - 1 VIEW SAME DAY  COMPARISON:  05/30/2015, 05/28/2015   FINDINGS: The lungs are hyperinflated likely secondary to COPD. There is an endotracheal tube with the tip 4 cm above the carina. There is a nasogastric tube with the tip projecting over the stomach.  There is hazy right basilar airspace disease which may reflect atelectasis versus developing pneumonia. There is no pleural effusion or pneumothorax. The heart and mediastinal contours are unremarkable.  The osseous structures are unremarkable.  IMPRESSION: 1. Endotracheal tube with the tip 4 cm above the carina.   Electronically Signed   By: Kathreen Devoid   On: 05/30/2015 13:11    Medications:  Prior to Admission:  Prescriptions prior to admission  Medication Sig Dispense Refill Last Dose  . polyethylene glycol (MIRALAX / GLYCOLAX) packet Take 17 g by mouth daily. 14 each 0 05/28/2015 at Unknown time   Scheduled: . albuterol  2.5 mg Nebulization Q6H  . antiseptic oral rinse  7 mL Mouth Rinse q12n4p  . antiseptic oral rinse  7 mL Mouth Rinse QID  . chlorhexidine  15 mL Mouth Rinse BID  . chlorhexidine  15 mL Mouth Rinse BID  . enoxaparin (LOVENOX) injection  30 mg Subcutaneous Q24H  . feeding supplement (ENSURE ENLIVE)  237 mL Oral BID BM  . feeding supplement (PRO-STAT SUGAR FREE 64)  30 mL Oral BID  . feeding supplement (VITAL HIGH PROTEIN)  1,000 mL Per Tube Q24H  . fentaNYL (SUBLIMAZE) injection  50 mcg Intravenous Once  . metoprolol succinate  50 mg Oral BID WC  . pantoprazole (PROTONIX) IV  40 mg Intravenous Q24H  . piperacillin-tazobactam (ZOSYN)  IV  2.25 g Intravenous Q8H   Continuous: . dextrose 5 % with KCl 20 mEq / L 20 mEq (05/31/15 0845)  .  dextrose 5 % and 0.45% NaCl 125 mL/hr at 05/30/15 0151  . fentaNYL infusion INTRAVENOUS 50 mcg/hr (05/31/15 0800)   IPP:[id number] **OR** acetaminophen, fentaNYL, fentaNYL (SUBLIMAZE) injection, fentaNYL (SUBLIMAZE) injection, hydrALAZINE, midazolam, midazolam, ondansetron **OR** ondansetron (ZOFRAN) IV  Assesment: He has acute  ventilator-dependent respiratory failure. He has multiple other medical problems as noted. He is significantly malnourished. He has dementia at baseline. Active Problems:   Leukocytosis   Hypertension   Hypernatremia   ARF (acute renal failure)    Plan: He will continue treatments. Start tube feedings. I don't think we have any opportunity for weaning today    LOS: 3 days   Collyn Selk L 05/31/2015, 9:40 AM

## 2015-06-01 LAB — GLUCOSE, CAPILLARY
GLUCOSE-CAPILLARY: 54 mg/dL — AB (ref 65–99)
GLUCOSE-CAPILLARY: 99 mg/dL (ref 65–99)
GLUCOSE-CAPILLARY: 137 mg/dL — AB (ref 65–99)
GLUCOSE-CAPILLARY: 115 mg/dL — AB (ref 65–99)
GLUCOSE-CAPILLARY: 130 mg/dL — AB (ref 65–99)
Glucose-Capillary: 85 mg/dL (ref 65–99)
Glucose-Capillary: 89 mg/dL (ref 65–99)
Glucose-Capillary: 15 mg/dL — CL (ref 65–99)
Glucose-Capillary: 48 mg/dL — ABNORMAL LOW (ref 65–99)
Glucose-Capillary: 63 mg/dL — ABNORMAL LOW (ref 65–99)
Glucose-Capillary: 76 mg/dL (ref 65–99)

## 2015-06-01 LAB — BASIC METABOLIC PANEL
ANION GAP: 7 (ref 5–15)
BUN: 41 mg/dL — ABNORMAL HIGH (ref 6–20)
CALCIUM: 8.5 mg/dL — AB (ref 8.9–10.3)
CO2: 26 mmol/L (ref 22–32)
CREATININE: 1.79 mg/dL — AB (ref 0.61–1.24)
Chloride: 114 mmol/L — ABNORMAL HIGH (ref 101–111)
GFR calc Af Amer: 35 mL/min — ABNORMAL LOW (ref >60)
GFR calc non Af Amer: 30 mL/min — ABNORMAL LOW (ref >60)
Glucose, Bld: 100 mg/dL — ABNORMAL HIGH (ref 65–99)
Potassium: 4 mmol/L (ref 3.5–5.1)
Sodium: 147 mmol/L — ABNORMAL HIGH (ref 135–145)

## 2015-06-01 MED ORDER — GLUCOSE 40 % PO GEL
ORAL | Status: AC
  Administered 2015-06-01: 37.5 g
  Filled 2015-06-01: qty 1

## 2015-06-01 MED ORDER — CLINDAMYCIN PHOSPHATE 600 MG/50ML IV SOLN
600.0000 mg | Freq: Three times a day (TID) | INTRAVENOUS | Status: DC
  Administered 2015-06-01 – 2015-06-04 (×10): 600 mg via INTRAVENOUS
  Filled 2015-06-01 (×13): qty 50

## 2015-06-01 MED ORDER — METOCLOPRAMIDE HCL 5 MG/ML IJ SOLN
5.0000 mg | Freq: Four times a day (QID) | INTRAMUSCULAR | Status: DC
  Administered 2015-06-01 – 2015-06-04 (×13): 5 mg via INTRAVENOUS
  Filled 2015-06-01 (×13): qty 2

## 2015-06-01 MED ORDER — DEXTROSE-NACL 5-0.45 % IV SOLN
INTRAVENOUS | Status: DC
  Administered 2015-06-01 (×2): via INTRAVENOUS
  Administered 2015-06-02 (×2): 1000 mL via INTRAVENOUS

## 2015-06-01 MED ORDER — CLINDAMYCIN PHOSPHATE 300 MG/50ML IV SOLN
300.0000 mg | Freq: Four times a day (QID) | INTRAVENOUS | Status: DC
  Filled 2015-06-01 (×6): qty 50

## 2015-06-01 NOTE — Progress Notes (Signed)
Pt had a hypoglycemic event. CBG of 45; gave glucose gel per protocol. Recheck CBG was 63; gave another glucose gel. Rechecked CBG again and it was 76. Pt has fluids containing dextrose and is on tube feeding. Will continue to monitor.

## 2015-06-01 NOTE — Progress Notes (Signed)
Pt had another hypoglycemic event. CBG was 54; glucose gel given per protocol. Will recheck. MD notified. Orders received and carried out.

## 2015-06-01 NOTE — Progress Notes (Signed)
Subjective: He is overall about the same. He is still having a lot of thick food-like secretions. His heart rate had been up but he has started back on his beta blocker now. Multiple attempts at PICC line were made yesterday unsuccessfully  Objective: Vital signs in last 24 hours: Temp:  [98.3 F (36.8 C)-101.7 F (38.7 C)] 98.8 F (37.1 C) (07/31 0519) Pulse Rate:  [74-116] 77 (07/31 0522) Resp:  [13-30] 13 (07/31 0600) BP: (48-130)/(34-91) 94/46 mmHg (07/31 0600) SpO2:  [10 %-100 %] 93 % (07/31 0522) FiO2 (%):  [40 %-41 %] 40 % (07/31 0400) Weight:  [46 kg (101 lb 6.6 oz)] 46 kg (101 lb 6.6 oz) (07/31 0522) Weight change: 2.7 kg (5 lb 15.2 oz) Last BM Date: 05/30/15  Intake/Output from previous day: 07/30 0701 - 07/31 0700 In: 3589.7 [I.V.:3203.1; NG/GT:236.7; IV Piggyback:150] Out: 1025 [Urine:1025]  PHYSICAL EXAM General appearance: Intubated sedated and on the ventilator Resp: rhonchi bilaterally Cardio: regular rate and rhythm, S1, S2 normal, no murmur, click, rub or gallop GI: soft, non-tender; bowel sounds normal; no masses,  no organomegaly Extremities: Very thin  Lab Results:  Results for orders placed or performed during the hospital encounter of 05/28/15 (from the past 48 hour(s))  Draw ABG 1 hour after initiation of ventilator     Status: Abnormal   Collection Time: 05/30/15  2:51 PM  Result Value Ref Range   FIO2 100.00    Delivery systems VENTILATOR    Mode PRESSURE REGULATED VOLUME CONTROL    VT 440 mL   LHR 15 resp/min   Peep/cpap 5.0 cm H20   pH, Arterial 7.352 7.350 - 7.450   pCO2 arterial 42.9 35.0 - 45.0 mmHg   pO2, Arterial 428 (H) 80.0 - 100.0 mmHg   Bicarbonate 23.2 20.0 - 24.0 mEq/L   TCO2 20.4 0 - 100 mmol/L   Acid-base deficit 1.9 0.0 - 2.0 mmol/L   O2 Saturation 99.6 %   Collection site LEFT RADIAL    Drawn by COLLECTED BY RT    Sample type ARTERIAL    Allens test (pass/fail) PASS PASS  Basic metabolic panel     Status: Abnormal    Collection Time: 05/31/15  4:53 AM  Result Value Ref Range   Sodium 161 (HH) 135 - 145 mmol/L    Comment: CRITICAL RESULT CALLED TO, READ BACK BY AND VERIFIED WITH: NIELSON T. AT 6203T ON [id number] BY THOMPSON S.    Potassium 3.3 (L) 3.5 - 5.1 mmol/L   Chloride 124 (H) 101 - 111 mmol/L   CO2 25 22 - 32 mmol/L   Glucose, Bld 127 (H) 65 - 99 mg/dL   BUN 45 (H) 6 - 20 mg/dL   Creatinine, Ser 1.62 (H) 0.61 - 1.24 mg/dL   Calcium 8.9 8.9 - 10.3 mg/dL   GFR calc non Af Amer 34 (L) >60 mL/min   GFR calc Af Amer 40 (L) >60 mL/min    Comment: (NOTE) The eGFR has been calculated using the CKD EPI equation. This calculation has not been validated in all clinical situations. eGFR's persistently <60 mL/min signify possible Chronic Kidney Disease.    Anion gap 12 5 - 15  Blood gas, arterial     Status: None   Collection Time: 05/31/15  5:25 AM  Result Value Ref Range   FIO2 40.00    Delivery systems VENTILATOR    Mode PRESSURE REGULATED VOLUME CONTROL    VT 440.0 mL   LHR 15.0 resp/min  Peep/cpap 5.0 cm H20   pH, Arterial 7.381 7.350 - 7.450   pCO2 arterial 40.0 35.0 - 45.0 mmHg   pO2, Arterial 89.4 80.0 - 100.0 mmHg   Bicarbonate 23.1 20.0 - 24.0 mEq/L   TCO2 20.7 0 - 100 mmol/L   Acid-Base Excess 1.2 0.0 - 2.0 mmol/L   O2 Saturation 96.7 %   Collection site LEFT RADIAL    Drawn by [id number]    Sample type ARTERIAL    Allens test (pass/fail) PASS PASS  Glucose, capillary     Status: Abnormal   Collection Time: 05/31/15  3:31 PM  Result Value Ref Range   Glucose-Capillary 21 (LL) 65 - 99 mg/dL   Comment 1 Notify RN    Comment 2 Document in Chart   Glucose, capillary     Status: None   Collection Time: 05/31/15  3:34 PM  Result Value Ref Range   Glucose-Capillary 95 65 - 99 mg/dL   Comment 1 Notify RN    Comment 2 Document in Chart   Glucose, capillary     Status: None   Collection Time: 05/31/15  6:24 PM  Result Value Ref Range   Glucose-Capillary 67 65 - 99 mg/dL   Comment 1  Notify RN    Comment 2 Document in Chart   Glucose, capillary     Status: None   Collection Time: 05/31/15  7:31 PM  Result Value Ref Range   Glucose-Capillary 90 65 - 99 mg/dL  Culture, blood (routine x 2)     Status: None (Preliminary result)   Collection Time: 05/31/15 11:10 PM  Result Value Ref Range   Specimen Description BLOOD RIGHT ANTECUBITAL    Special Requests BOTTLES DRAWN AEROBIC AND ANAEROBIC 6CC EACH    Culture NO GROWTH < 12 HOURS    Report Status PENDING   Culture, blood (routine x 2)     Status: None (Preliminary result)   Collection Time: 05/31/15 11:17 PM  Result Value Ref Range   Specimen Description BLOOD LEFT ARM    Special Requests BOTTLES DRAWN AEROBIC AND ANAEROBIC 6CC EACH    Culture NO GROWTH < 12 HOURS    Report Status PENDING   Glucose, capillary     Status: Abnormal   Collection Time: 05/31/15 11:48 PM  Result Value Ref Range   Glucose-Capillary 112 (H) 65 - 99 mg/dL  Basic metabolic panel     Status: Abnormal   Collection Time: 06/01/15  5:06 AM  Result Value Ref Range   Sodium 147 (H) 135 - 145 mmol/L    Comment: RESULT REPEATED AND VERIFIED DELTA CHECK NOTED    Potassium 4.0 3.5 - 5.1 mmol/L    Comment: DELTA CHECK NOTED   Chloride 114 (H) 101 - 111 mmol/L   CO2 26 22 - 32 mmol/L   Glucose, Bld 100 (H) 65 - 99 mg/dL   BUN 41 (H) 6 - 20 mg/dL   Creatinine, Ser 1.79 (H) 0.61 - 1.24 mg/dL   Calcium 8.5 (L) 8.9 - 10.3 mg/dL   GFR calc non Af Amer 30 (L) >60 mL/min   GFR calc Af Amer 35 (L) >60 mL/min    Comment: (NOTE) The eGFR has been calculated using the CKD EPI equation. This calculation has not been validated in all clinical situations. eGFR's persistently <60 mL/min signify possible Chronic Kidney Disease.    Anion gap 7 5 - 15  Glucose, capillary     Status: None   Collection Time: 06/01/15  5:21  AM  Result Value Ref Range   Glucose-Capillary 85 65 - 99 mg/dL    ABGS  Recent Labs  05/31/15 0525  PHART 7.381  PO2ART 89.4   TCO2 20.7  HCO3 23.1   CULTURES Recent Results (from the past 240 hour(s))  Urine culture     Status: None   Collection Time: 05/28/15  4:40 PM  Result Value Ref Range Status   Specimen Description URINE, CATHETERIZED  Final   Special Requests NONE  Final   Culture   Final    20,000 COLONIES/mL STAPHYLOCOCCUS SPECIES (COAGULASE NEGATIVE) Performed at [facility]    Report Status 05/31/2015 FINAL  Final   Organism ID, Bacteria STAPHYLOCOCCUS SPECIES (COAGULASE NEGATIVE)  Final      Susceptibility   Staphylococcus species (coagulase negative) - MIC*    CIPROFLOXACIN <=0.5 SENSITIVE Sensitive     GENTAMICIN <=0.5 SENSITIVE Sensitive     NITROFURANTOIN <=16 SENSITIVE Sensitive     OXACILLIN <=0.25 SENSITIVE Sensitive     TETRACYCLINE 2 SENSITIVE Sensitive     VANCOMYCIN <=0.5 SENSITIVE Sensitive     TRIMETH/SULFA <=10 SENSITIVE Sensitive     CLINDAMYCIN 0.5 SENSITIVE Sensitive     RIFAMPIN <=0.5 SENSITIVE Sensitive     Inducible Clindamycin NEGATIVE Sensitive     * 20,000 COLONIES/mL STAPHYLOCOCCUS SPECIES (COAGULASE NEGATIVE)  MRSA PCR Screening     Status: None   Collection Time: 05/28/15 11:52 PM  Result Value Ref Range Status   MRSA by PCR NEGATIVE NEGATIVE Final    Comment:        The GeneXpert MRSA Assay (FDA approved for NASAL specimens only), is one component of a comprehensive MRSA colonization surveillance program. It is not intended to diagnose MRSA infection nor to guide or monitor treatment for MRSA infections.   Culture, blood (routine x 2)     Status: None (Preliminary result)   Collection Time: 05/31/15 11:10 PM  Result Value Ref Range Status   Specimen Description BLOOD RIGHT ANTECUBITAL  Final   Special Requests BOTTLES DRAWN AEROBIC AND ANAEROBIC 6CC EACH  Final   Culture NO GROWTH < 12 HOURS  Final   Report Status PENDING  Incomplete  Culture, blood (routine x 2)     Status: None (Preliminary result)   Collection Time: 05/31/15 11:17 PM   Result Value Ref Range Status   Specimen Description BLOOD LEFT ARM  Final   Special Requests BOTTLES DRAWN AEROBIC AND ANAEROBIC 6CC EACH  Final   Culture NO GROWTH < 12 HOURS  Final   Report Status PENDING  Incomplete   Studies/Results: Portable Chest Xray  05/31/2015   CLINICAL DATA:  Shortness of breath, aspiration  EXAM: PORTABLE CHEST - 1 VIEW  COMPARISON:  05/30/2015  FINDINGS: Endotracheal tube terminates 4 cm above the carina.  Mild bibasilar opacities, likely atelectasis. No focal consolidation. Mild blunting of the left costophrenic angle, possibly reflecting a small pleural effusion. No pneumothorax.  The heart is normal in size.  Enteric tube courses into the stomach.  IMPRESSION: Endotracheal tube terminates 4 cm above the carina.  Mild bibasilar opacities, likely atelectasis.   Electronically Signed   By: Julian Hy M.D.   On: 05/31/2015 08:22   Dg Chest Port 1 View  05/30/2015   CLINICAL DATA:  Aspiration.  EXAM: PORTABLE CHEST - 1 VIEW  COMPARISON:  May 28, 2015.  FINDINGS: The heart size and mediastinal contours are within normal limits. No pneumothorax or pleural effusion is noted. Minimal bibasilar subsegmental  atelectasis is noted. Mild dextroscoliosis of thoracic spine.  IMPRESSION: Minimal bibasilar subsegmental atelectasis.   Electronically Signed   By: Marijo Conception, M.D.   On: 05/30/2015 12:20   Dg Chest Port 1v Same Day  05/30/2015   CLINICAL DATA:  ET tube placement  EXAM: PORTABLE CHEST - 1 VIEW SAME DAY  COMPARISON:  05/30/2015, 05/28/2015  FINDINGS: The lungs are hyperinflated likely secondary to COPD. There is an endotracheal tube with the tip 4 cm above the carina. There is a nasogastric tube with the tip projecting over the stomach.  There is hazy right basilar airspace disease which may reflect atelectasis versus developing pneumonia. There is no pleural effusion or pneumothorax. The heart and mediastinal contours are unremarkable.  The osseous structures  are unremarkable.  IMPRESSION: 1. Endotracheal tube with the tip 4 cm above the carina.   Electronically Signed   By: Kathreen Devoid   On: 05/30/2015 13:11    Medications:  Prior to Admission:  Prescriptions prior to admission  Medication Sig Dispense Refill Last Dose  . polyethylene glycol (MIRALAX / GLYCOLAX) packet Take 17 g by mouth daily. 14 each 0 05/28/2015 at Unknown time   Scheduled: . albuterol  2.5 mg Nebulization Q6H  . antiseptic oral rinse  7 mL Mouth Rinse q12n4p  . antiseptic oral rinse  7 mL Mouth Rinse QID  . chlorhexidine  15 mL Mouth Rinse BID  . chlorhexidine  15 mL Mouth Rinse BID  . clindamycin (CLEOCIN) IV  300 mg Intravenous 4 times per day  . enoxaparin (LOVENOX) injection  30 mg Subcutaneous Q24H  . feeding supplement (PRO-STAT SUGAR FREE 64)  30 mL Per Tube Daily  . feeding supplement (VITAL AF 1.2 CAL)  1,000 mL Per Tube Q24H  . fentaNYL (SUBLIMAZE) injection  50 mcg Intravenous Once  . metoCLOPramide (REGLAN) injection  5 mg Intravenous 4 times per day  . metoprolol tartrate  50 mg Oral BID  . pantoprazole (PROTONIX) IV  40 mg Intravenous Q24H   Continuous: . dextrose 5 % with KCl 20 mEq / L 20 mEq (06/01/15 0602)  . dextrose 5 % and 0.45% NaCl 125 mL/hr at 05/30/15 0151  . fentaNYL infusion INTRAVENOUS 50 mcg/hr (06/01/15 0602)   XTK:[id number] (TYLENOL) oral liquid 160 mg/5 mL, acetaminophen **OR** acetaminophen, fentaNYL, fentaNYL (SUBLIMAZE) injection, fentaNYL (SUBLIMAZE) injection, hydrALAZINE, midazolam, midazolam, ondansetron **OR** ondansetron (ZOFRAN) IV  Assesment: He has acute ventilator-dependent respiratory failure. He aspirated. He is on Zosyn. His urine is growing Staphylococcus 20,000 units but this is a catheterized specimen. It is sensitive to clindamycin and his aspiration pneumonia should be sensitive to clindamycin and also so I'm going to switch him.  His acute renal failure is improving  His hypernatremia is improving  He has  malnutrition and is not tolerating tube feedings very well.  He has lack of IV access Active Problems:   Leukocytosis   Hypertension   Hypernatremia   ARF (acute renal failure)    Plan: Switch him to clindamycin. I chose low-dose but would like to discuss with pharmacy about dosing.  I discussed with Dr. Roxanne Mins ED physician and although he is going off shift now he will pass on to the next physician that we may need help with IV access because our general surgeon is out of town who does central lines , he failed PICC line and I'm concerned about transferring him to interventional radiology for IV placement. I added Reglan    LOS: 4 days  Candiace West L 06/01/2015, 7:04 AM

## 2015-06-01 NOTE — Progress Notes (Signed)
Subjective: none  Objective: Vital signs in last 24 hours: Temp:  [98.3 F (36.8 C)-101.7 F (38.7 C)] 98.8 F (37.1 C) (07/31 0747) Pulse Rate:  [72-116] 77 (07/31 0937) Resp:  [13-30] 13 (07/31 0700) BP: (48-130)/(34-91) 103/48 mmHg (07/31 0937) SpO2:  [93 %-100 %] 100 % (07/31 0811) FiO2 (%):  [40 %] 40 % (07/31 0812) Weight:  [101 lb 6.6 oz (46 kg)] 101 lb 6.6 oz (46 kg) (07/31 0522)  Intake/Output from previous day: 07/30 0701 - 07/31 0700 In: 4105.4 [I.V.:3378.8; NG/GT:576.7; IV Piggyback:150] Out: 1025 [Urine:1025] Intake/Output this shift: Total I/O In: 30 [NG/GT:30] Out: -   No results for input(s): HGB in the last 72 hours. No results for input(s): WBC, RBC, HCT, PLT in the last 72 hours.  Recent Labs  05/31/15 0453 06/01/15 0506  NA 161* 147*  K 3.3* 4.0  CL 124* 114*  CO2 25 26  BUN 45* 41*  CREATININE 1.62* 1.79*  GLUCOSE 127* 100*  CALCIUM 8.9 8.5*   No results for input(s): LABPT, INR in the last 72 hours.  Patient remained  intubated and seadated Chest : Decreased breath sound have some inspiratory rhonchi. Heart exam regular rate and rhythm no murmur Extremities no edema   Assessment/Plan: Problem #1 hypernatremia: His sodium is improving progressively Problem #2 acute kidney injury: His BUN and creatinine is slightly higher than yesterday. Patient is none oliguric Problem #3 hypokalemia: Presently patient is on potassium supplement. His potassium has corrected Problem #4 status post aspiration yesterday and presently intubated. Problem #5 hypertension: Patient with occassional hypotension. Patient on Metoprolol for tachycardia Problem #6 UTI Problem # 7 dementia Problem#8 s/p aspiratipn and presently intubated Plan:1] Change his ivf to d51/2 ns at 75 cc/hr 3] will check his basic metabolic panel in the morning.   Zerenity Bowron S 06/01/2015, 10:07 AM

## 2015-06-01 NOTE — Progress Notes (Signed)
Dr Everette Rank called about patient having tmax of 101.7 axillary and elevated heart rate for two days without patient receiving previously scheduled beta blocker; Toprol XL is unable to be crushed and placed down the patients NGT; order changed to metoprolol tartrate via NGT first dose now; also notified that urine culture sensitivity was back if antibiotics needed to be adjusted; orders received and placed in the system

## 2015-06-01 NOTE — Plan of Care (Signed)
Problem: Phase I Progression Outcomes Goal: VTE prophylaxis Outcome: Completed/Met Date Met:  06/01/15 lovenox ordered    Goal: GIProphysixis Outcome: Completed/Met Date Met:  06/01/15 protonix ordered

## 2015-06-01 NOTE — ED Provider Notes (Signed)
Called to ICU to help with IV access for ventilated patient.  Angiocath insertion Performed by: Ezequiel Essex  Consent: unable to provide. Risks and benefits: risks, benefits and alternatives were discussed Time out: Immediately prior to procedure a "time out" was called to verify the correct patient, procedure, equipment, support staff and site/side marked as required.  Preparation: Patient was prepped and draped in the usual sterile fashion.  Vein Location: R EJ  Not Ultrasound Guided  Gauge: 20  Normal blood return and flush without difficulty Patient tolerance: Patient tolerated the procedure well with no immediate complications.     Ezequiel Essex, MD 06/01/15 [phone]

## 2015-06-01 NOTE — Plan of Care (Signed)
Problem: ICU Phase Progression Outcomes Goal: O2 sats trending toward baseline Outcome: Progressing O2 Sats upper 90's on 40% via vent Goal: Dyspnea controlled at rest Outcome: Completed/Met Date Met:  06/01/15 No dyspnea noted on assessment Goal: Hemodynamically stable Outcome: Progressing Vital signs have been stable with current treatments Goal: Pain controlled with appropriate interventions Outcome: Progressing Currently on a Fentanyl drip for sedation on the vent Goal: Flu/PneumoVaccines if indicated Outcome: Completed/Met Date Met:  06/01/15 Patient has already seen Goal: Initial discharge plan identified Outcome: Completed/Met Date Met:  06/01/15 Back to group home Goal: Voiding-avoid urinary catheter unless indicated Outcome: Progressing Currently has a foley for strict I&O and status post arrest on 05/30/2015

## 2015-06-02 ENCOUNTER — Encounter (HOSPITAL_COMMUNITY): Payer: Self-pay | Admitting: Primary Care

## 2015-06-02 DIAGNOSIS — T17998S Other foreign object in respiratory tract, part unspecified causing other injury, sequela: Secondary | ICD-10-CM

## 2015-06-02 DIAGNOSIS — R627 Adult failure to thrive: Secondary | ICD-10-CM | POA: Insufficient documentation

## 2015-06-02 DIAGNOSIS — T17908A Unspecified foreign body in respiratory tract, part unspecified causing other injury, initial encounter: Secondary | ICD-10-CM | POA: Diagnosis not present

## 2015-06-02 DIAGNOSIS — Z7189 Other specified counseling: Secondary | ICD-10-CM | POA: Diagnosis not present

## 2015-06-02 DIAGNOSIS — E860 Dehydration: Secondary | ICD-10-CM

## 2015-06-02 DIAGNOSIS — Z515 Encounter for palliative care: Secondary | ICD-10-CM | POA: Diagnosis not present

## 2015-06-02 LAB — BLOOD GAS, ARTERIAL
ACID-BASE DEFICIT: 2.6 mmol/L — AB (ref 0.0–2.0)
Bicarbonate: 22.6 mEq/L (ref 20.0–24.0)
Drawn by: 25788
FIO2: 40
Mode: POSITIVE
O2 Saturation: 97.9 %
PCO2 ART: 42.5 mmHg (ref 35.0–45.0)
PEEP/CPAP: 5 cmH2O
PO2 ART: 104 mmHg — AB (ref 80.0–100.0)
Pressure support: 5 cmH2O
TCO2: 20.1 mmol/L (ref 0–100)
pH, Arterial: 7.344 — ABNORMAL LOW (ref 7.350–7.450)

## 2015-06-02 LAB — BASIC METABOLIC PANEL
Anion gap: 6 (ref 5–15)
Anion gap: 8 (ref 5–15)
BUN: 40 mg/dL — ABNORMAL HIGH (ref 6–20)
BUN: 39 mg/dL — AB (ref 6–20)
CALCIUM: 8.2 mg/dL — AB (ref 8.9–10.3)
CALCIUM: 8.2 mg/dL — AB (ref 8.9–10.3)
CHLORIDE: 114 mmol/L — AB (ref 101–111)
CO2: 22 mmol/L (ref 22–32)
CO2: 22 mmol/L (ref 22–32)
Chloride: 115 mmol/L — ABNORMAL HIGH (ref 101–111)
Creatinine, Ser: 1.83 mg/dL — ABNORMAL HIGH (ref 0.61–1.24)
Creatinine, Ser: 1.77 mg/dL — ABNORMAL HIGH (ref 0.61–1.24)
GFR calc Af Amer: 36 mL/min — ABNORMAL LOW (ref >60)
GFR calc non Af Amer: 30 mL/min — ABNORMAL LOW (ref >60)
GFR calc non Af Amer: 31 mL/min — ABNORMAL LOW (ref >60)
GFR, EST AFRICAN AMERICAN: 34 mL/min — AB (ref >60)
GLUCOSE: 143 mg/dL — AB (ref 65–99)
Glucose, Bld: 136 mg/dL — ABNORMAL HIGH (ref 65–99)
Potassium: 3.7 mmol/L (ref 3.5–5.1)
Potassium: 3.6 mmol/L (ref 3.5–5.1)
Sodium: 143 mmol/L (ref 135–145)
Sodium: 144 mmol/L (ref 135–145)

## 2015-06-02 LAB — GLUCOSE, CAPILLARY
GLUCOSE-CAPILLARY: 154 mg/dL — AB (ref 65–99)
Glucose-Capillary: 21 mg/dL — CL (ref 65–99)
Glucose-Capillary: 128 mg/dL — ABNORMAL HIGH (ref 65–99)
Glucose-Capillary: 127 mg/dL — ABNORMAL HIGH (ref 65–99)
Glucose-Capillary: 126 mg/dL — ABNORMAL HIGH (ref 65–99)
Glucose-Capillary: 125 mg/dL — ABNORMAL HIGH (ref 65–99)

## 2015-06-02 LAB — CBC
HEMATOCRIT: 37.1 % — AB (ref 39.0–52.0)
Hemoglobin: 12.3 g/dL — ABNORMAL LOW (ref 13.0–17.0)
MCH: 29.1 pg (ref 26.0–34.0)
MCHC: 33.2 g/dL (ref 30.0–36.0)
MCV: 87.7 fL (ref 78.0–100.0)
Platelets: 80 10*3/uL — ABNORMAL LOW (ref 150–400)
RBC: 4.23 MIL/uL (ref 4.22–5.81)
RDW: 15.8 % — ABNORMAL HIGH (ref 11.5–15.5)
WBC: 20.3 10*3/uL — ABNORMAL HIGH (ref 4.0–10.5)

## 2015-06-02 MED ORDER — ACETAMINOPHEN 10 MG/ML IV SOLN
1000.0000 mg | Freq: Once | INTRAVENOUS | Status: AC
  Administered 2015-06-02: 1000 mg via INTRAVENOUS
  Filled 2015-06-02: qty 100

## 2015-06-02 MED ORDER — ACETAMINOPHEN 10 MG/ML IV SOLN
INTRAVENOUS | Status: AC
  Filled 2015-06-02: qty 100

## 2015-06-02 NOTE — Consult Note (Signed)
Consultation Note Date: 06/02/2015   Patient Name: Jerry [name]  DOB: 1916-08-20  MRN: [id number]  Age / Sex: 81 y.o., male   PCP: Marjean Donna, MD Referring Physician: Sinda Du, MD  Reason for Consultation: Establishing goals of care and Psychosocial/spiritual support  Palliative Care Assessment and Plan Summary of Established Goals of Care and Medical Treatment Preferences   Clinical Assessment/Narrative: Mr. Sherrin looks frail and elderly lying in bed.  He is intubated/ventilated, currently weaning.  He has been off sedative drips for approximately 2 hours, and although he will briefly open his eyes he does not make eye contact.  Mr. Oyama son and daughter in law, Iona Beard and Lendell Caprice, come to the hospital for a [facility] meeting.  We talk about Mr. Hoelting living independently in [place] until his wife died around 5.5 years ago, when he moved to [place] to live with Iona Beard.  After a hospitalization for dehydration around 3 months ago, it was decided that Mr. Griffin needed more care and was moved to [facility] in [place].  Mr. Delap has experienced a steady decline in the time he has been in [facility]. Virgilio Belling talks about his "hard headedness" and sitting out in the sun until he became dehydrated.  They also talk about what sounds to be sun downers, and increased falls.  We discuss the normal dementia trajectory, including decreased intake and increased risks for aspiration.  We share the chronic illness diagram, and they state this mirrors Mr. Burkett decline. Daily ventilator weaning trial is explained and the worry that Mr. Yager will not be able to extubate, and that even if he successfully extubates he will have a poor quality of life and likely be bed bound.  Family states this is also what they expect.  We talk bout the need for SNF if Mr. Fosberg is able to survive.  We talk about continued declines in intake and the concept of feeding tubes, which Iona Beard  declines.   Son, Binnie Kand, states that he has accepted that Mr. Daft may not be able to overcome this illness.  He states that if we try everything we can, without improvement, then he sees no use to put his father through this pain.  We talk about compassionate extubation, the discomfort of the ventilator, and the concept of "allow natural death".  Iona Beard states he is accepting that this may be Mr. Warshawsky end of life.    Virgilio Belling talks about the possibility that Mr. Rorke may not have aspirated if she hadn't fed him, but reassurances were given that aspiration is common/expected with dementia.    Contacts/Participants in Discussion: Primary Decision Maker: Son, Binnie Kand; supported by his wife Lendell Caprice.    HCPOA:  None listed  Code Status/Advance Care Planning:  DNR  Considering compassionate extubation if no improvement.   Feeding tube: No  Symptom Management:   Fentanyl gtt for pain/sedation with ventilation.   Palliative Prophylaxis: Protonix IV while intubated.   Psycho-social/Spiritual:   Support System: Son and daughter in law, Iona Beard and Lendell Caprice  Desire for further Chaplaincy support:  Not discussed today.   Prognosis: < 6 months  We discussed that we would not be surprised if Mr. Champeau were to decline and transition in the next few days, but he is not likely to live past 6 months.   Discharge Planning:  Further decisions based on outcomes over the next 24-48 hours.        Chief Complaint:  Lethargy History of Present  Illness: 81 year old male who has a past medical history of Cancer; Prostate cancer; Alzheimer's dementia; Stroke; FTT (failure to thrive) in adult. Patient sent from skilled facility for lethargy. Patient is unable to provide any history, no family at bedside. History obtained from the ED records. Patient was sent from the skilled facility for poor by mouth intake for past several days. And has been feeling extremely weak.  Patient has a history of dementia, though is alert but not able to provide any significant history. In the ED patient found to have profound hyponatremia with sodium 180, BUN 70, creatinine 2.2, potassium 3.5, 135 no chemistry results in the Epic.  Mr. Poss experienced a decline Friday and was intubated/ventilated d/t aspiration of food.  He has been supported since.   Primary Diagnoses  Present on Admission:  . ARF (acute renal failure) . Hypernatremia . Leukocytosis . Hypertension  Palliative Review of Systems: Unable to participate in ROS.    I have reviewed the medical record, interviewed the patient and family, and examined the patient. The following aspects are pertinent.  Past Medical History  Diagnosis Date  . Cancer   . Prostate cancer   . Alzheimer's dementia   . Stroke   . FTT (failure to thrive) in adult   . Somnolence 12/2014    due to dehydration   History   Social History  . Marital Status: Widowed    Spouse Name: N/A  . Number of Children: N/A  . Years of Education: N/A   Social History Main Topics  . Smoking status: Current Every Day Smoker -- 0.50 packs/day for 76 years    Types: Cigarettes  . Smokeless tobacco: Not on file  . Alcohol Use: Yes     Comment: occasional  . Drug Use: No  . Sexual Activity: No   Other Topics Concern  . None   Social History Narrative   No family history on file. Scheduled Meds: . albuterol  2.5 mg Nebulization Q6H  . antiseptic oral rinse  7 mL Mouth Rinse q12n4p  . antiseptic oral rinse  7 mL Mouth Rinse QID  . chlorhexidine  15 mL Mouth Rinse BID  . clindamycin (CLEOCIN) IV  600 mg Intravenous Q8H  . enoxaparin (LOVENOX) injection  30 mg Subcutaneous Q24H  . feeding supplement (PRO-STAT SUGAR FREE 64)  30 mL Per Tube Daily  . feeding supplement (VITAL AF 1.2 CAL)  1,000 mL Per Tube Q24H  . fentaNYL (SUBLIMAZE) injection  50 mcg Intravenous Once  . metoCLOPramide (REGLAN) injection  5 mg Intravenous 4 times  per day  . metoprolol tartrate  50 mg Oral BID  . pantoprazole (PROTONIX) IV  40 mg Intravenous Q24H   Continuous Infusions: . dextrose 5 % and 0.45% NaCl 125 mL/hr at 06/02/15 0701  . fentaNYL infusion INTRAVENOUS Stopped (06/02/15 0825)   PRN Meds:.acetaminophen (TYLENOL) oral liquid 160 mg/5 mL, acetaminophen **OR** acetaminophen, fentaNYL, fentaNYL (SUBLIMAZE) injection, fentaNYL (SUBLIMAZE) injection, hydrALAZINE, midazolam, midazolam, ondansetron **OR** ondansetron (ZOFRAN) IV Medications Prior to Admission:  Prior to Admission medications   Medication Sig Start Date End Date Taking? Authorizing Provider  polyethylene glycol (MIRALAX / GLYCOLAX) packet Take 17 g by mouth daily. 01/15/15  Yes Donne Hazel, MD   No Known Allergies CBC:    Component Value Date/Time   WBC 20.3* 06/02/2015 0026   HGB 12.3* 06/02/2015 0026   HCT 37.1* 06/02/2015 0026   PLT 80* 06/02/2015 0026   MCV 87.7 06/02/2015 0026   NEUTROABS 17.9*  05/28/2015 1642   LYMPHSABS 2.1 05/28/2015 1642   MONOABS 2.5* 05/28/2015 1642   EOSABS 0.0 05/28/2015 1642   BASOSABS 0.0 05/28/2015 1642   Comprehensive Metabolic Panel:    Component Value Date/Time   NA 144 06/02/2015 0433   K 3.6 06/02/2015 0433   CL 114* 06/02/2015 0433   CO2 22 06/02/2015 0433   BUN 39* 06/02/2015 0433   CREATININE 1.77* 06/02/2015 0433   GLUCOSE 136* 06/02/2015 0433   CALCIUM 8.2* 06/02/2015 0433   AST 83* 05/29/2015 0404   ALT 49 05/29/2015 0404   ALKPHOS 82 05/29/2015 0404   BILITOT 1.1 05/29/2015 0404   PROT 6.5 05/29/2015 0404   ALBUMIN 3.5 05/29/2015 0404    Physical Exam: Vital Signs: BP 145/73 mmHg  Pulse 92  Temp(Src) 97.2 F (36.2 C) (Axillary)  Resp 21  Ht 5\' 2"  (1.575 m)  Wt 48.6 kg (107 lb 2.3 oz)  BMI 19.59 kg/m2  SpO2 100% SpO2: SpO2: 100 % O2 Device: O2 Device: Ventilator O2 Flow Rate: O2 Flow Rate (L/min): 4 L/min Intake/output summary:  Intake/Output Summary (Last 24 hours) at 06/02/15 1124 Last  data filed at 06/02/15 0825  Gross per 24 hour  Intake 2939.58 ml  Output   2200 ml  Net 739.58 ml   LBM: Last BM Date: 05/30/15 Baseline Weight: Weight: 43.4 kg (95 lb 10.9 oz) Most recent weight: Weight: 48.6 kg (107 lb 2.3 oz) (cooling blanket on bed)  Exam Findings:  Constitutional: Elderly frail, lying in bed, ventilated, no eye contact, but will briefly open eyes.  Head: Temporal wasting.  Cardio: RRR Resp: Ventilated, RR 20, volume 550s on wean, 40%            Palliative Performance Scale: 10%              Additional Data Reviewed: Recent Labs     06/02/15  0026  06/02/15  0433  WBC  20.3*   --   HGB  12.3*   --   PLT  80*   --   NA  143  144  BUN  40*  39*  CREATININE  1.83*  1.77*     Time In: 1030 Time Out: 1200 Time Total: 90 minutes Greater than 50%  of this time was spent counseling and coordinating care related to the above assessment and plan. Discussion and goals of care shared with nursing staff and CM.   Signed by: Drue Novel, NP  Drue Novel, NP  06/02/2015, 11:24 AM  Please contact Palliative Medicine Team phone at [phone] for questions and concerns.

## 2015-06-02 NOTE — Progress Notes (Signed)
Large plug removed from ETT while suctioning. PAW was high and alerted of reduced air flow. All vitals and monitoring returned to normal status after removal. BP is improved from start of shift.  Heart rate is improved to the upper 80s. O2 sats at 95% +/-  RR is synchronous. No further.

## 2015-06-02 NOTE — Progress Notes (Signed)
Patient O2 sats now at 96-100%.

## 2015-06-02 NOTE — Progress Notes (Signed)
Nutrition Note: Follow Up  DOCUMENTATION CODES:  Severe malnutrition in context of chronic illness, Underweight  INTERVENTION:  Continuous Vital 1.2 @ 25 ml/hr via NGT   30 ml Prostat daily  Initial regimen will provide 820 kcal, 60 gr protein and  487 ml daily.  Monitor magnesium, potassium, and phosphorus daily for at least 3 days, MD to replete as needed, as pt is at risk for refeeding syndrome given his chronic malnutrition.  NUTRITION DIAGNOSIS:  Inadequate oral intake related to chronic illness as evidenced by severe depletion of body fat, severe depletion of muscle mass.  GOAL:  Provide < 1000 kcal/ day initially. If no significant refeeding then rate of advancement will be a reassessed.  MONITOR:  Vent status, labs, wt trends   REASON FOR ASSESSMENT:  Consult Enteral/tube feeding initiation and management  ASSESSMENT:  Patient is continues on ventilator support. Endotracheal tube and NGT.  Pt tolerating tube feeding today. Residuals 250 ml yesterday and feeding was held for "a couple of hours" per nursing then resumed.  Will continue at current rate for now. Palliative note reviewed. Possible weaning tomorrow?    MV: 10.3  L/min (7/29) Temp (24hrs), Avg:99.5 F (37.5 C), Min:96.6 F (35.9 C), Max:101.5 F (38.6 C)  (8/1) Labs: sodium 144, BUN - 39  Cr.1.77, Cl: 114,  glucose 136 mg/dl  Diet Order:  Diet NPO time specified  Skin:   WDL  Last BM:   7/29  Height:  Ht Readings from Last 1 Encounters:  06/01/15 5\' 2"  (1.575 m)   Weight:  Wt Readings from Last 1 Encounters:  06/02/15 107 lb 2.3 oz (48.6 kg)    Ideal Body Weight:  53.6 kg  BMI:  Body mass index is 19.59 kg/(m^2).   Estimated Nutritional Needs:  Kcal:  1311  Protein:  50-60 gr Fluid:  1300 ml daily  EDUCATION NEEDS:  Education needs no appropriate at this time  Colman Cater MS,RD,CSG,LDN Office: [phone] Pager: [phone]

## 2015-06-02 NOTE — Progress Notes (Signed)
Subjective: none  Objective: Vital signs in last 24 hours: Temp:  [96.6 F (35.9 C)-101.5 F (38.6 C)] 97.2 F (36.2 C) (08/01 0743) Pulse Rate:  [59-95] 84 (08/01 0800) Resp:  [12-19] 13 (08/01 0800) BP: (81-148)/(43-113) 119/64 mmHg (08/01 0800) SpO2:  [88 %-100 %] 100 % (08/01 0800) FiO2 (%):  [40 %] 40 % (08/01 0719) Weight:  [107 lb 2.3 oz (48.6 kg)] 107 lb 2.3 oz (48.6 kg) (08/01 0625)  Intake/Output from previous day: 07/31 0701 - 08/01 0700 In: 3426.3 [I.V.:2471.3; NG/GT:705; IV Piggyback:250] Out: 2200 [Urine:2200] Intake/Output this shift: Total I/O In: 50 [IV Piggyback:50] Out: -    Recent Labs  06/02/15 0026  HGB 12.3*    Recent Labs  06/02/15 0026  WBC 20.3*  RBC 4.23  HCT 37.1*  PLT 80*    Recent Labs  06/02/15 0026 06/02/15 0433  NA 143 144  K 3.7 3.6  CL 115* 114*  CO2 22 22  BUN 40* 39*  CREATININE 1.83* 1.77*  GLUCOSE 143* 136*  CALCIUM 8.2* 8.2*   No results for input(s): LABPT, INR in the last 72 hours.  Patient remained  intubated and sedated. No sigignicant change Chest : Decreased breath sound have some inspiratory rhonchi. Heart exam regular rate and rhythm no murmur Extremities no edema   Assessment/Plan: Problem #1 hypernatremia: His sodium has normaloliazed Problem #2 acute kidney injury: His renal function is improving and patient is none olgiuric Problem #3 hypokalemia: His potasium is normal Problem #4 status post aspiration yesterday and presently intubated. Problem #5 hypertension: Patient with occassional hypotension. Problem #6 UTI Problem # 7 dementia Problem#8 s/p aspiratipn and presently intubated and intubated but he is being weaned off from sedation. Plan:1] continue with present treatment 3] will check his basic metabolic panel in the morning.   Jalayiah Bibian S 06/02/2015, 8:43 AM

## 2015-06-02 NOTE — Progress Notes (Signed)
Pt weaned from [phone]. Pt met all requirements for extubation but the pallative care nurse and Dr. Luan Pulling we going to consult with each other. The family has made the Pt a DNR. The Pt should be extubated tomorrow.

## 2015-06-02 NOTE — Progress Notes (Signed)
Subjective: He is overall about the same. He remains intubated and on mechanical ventilator. He had some hypothermia last night and remains on a warming blanket. He still has some secretions but apparently they are better. Weaning is being attempted now  Objective: Vital signs in last 24 hours: Temp:  [96.6 F (35.9 C)-101.5 F (38.6 C)] 97.2 F (36.2 C) (08/01 0743) Pulse Rate:  [59-95] 80 (08/01 0600) Resp:  [12-19] 17 (08/01 0600) BP: (81-148)/(43-113) 109/50 mmHg (08/01 0600) SpO2:  [88 %-100 %] 100 % (08/01 0718) FiO2 (%):  [40 %] 40 % (08/01 0719) Weight:  [48.6 kg (107 lb 2.3 oz)] 48.6 kg (107 lb 2.3 oz) (08/01 0625) Weight change: 2.6 kg (5 lb 11.7 oz) Last BM Date: 05/30/15  Intake/Output from previous day: 07/31 0701 - 08/01 0700 In: 3426.3 [I.V.:2471.3; NG/GT:705; IV Piggyback:250] Out: 2200 [Urine:2200]  PHYSICAL EXAM General appearance: Intubated sedated, weaning. He is very thin. Resp: rhonchi bilaterally Cardio: regular rate and rhythm, S1, S2 normal, no murmur, click, rub or gallop GI: soft, non-tender; bowel sounds normal; no masses,  no organomegaly Extremities: extremities normal, atraumatic, no cyanosis or edema  Lab Results:  Results for orders placed or performed during the hospital encounter of 05/28/15 (from the past 48 hour(s))  Glucose, capillary     Status: Abnormal   Collection Time: 05/31/15  3:31 PM  Result Value Ref Range   Glucose-Capillary 21 (LL) 65 - 99 mg/dL   Comment 1 Notify RN    Comment 2 Document in Chart   Glucose, capillary     Status: None   Collection Time: 05/31/15  3:34 PM  Result Value Ref Range   Glucose-Capillary 95 65 - 99 mg/dL   Comment 1 Notify RN    Comment 2 Document in Chart   Glucose, capillary     Status: None   Collection Time: 05/31/15  6:24 PM  Result Value Ref Range   Glucose-Capillary 67 65 - 99 mg/dL   Comment 1 Notify RN    Comment 2 Document in Chart   Glucose, capillary     Status: None   Collection Time: 05/31/15  7:31 PM  Result Value Ref Range   Glucose-Capillary 90 65 - 99 mg/dL  Culture, blood (routine x 2)     Status: None (Preliminary result)   Collection Time: 05/31/15 11:10 PM  Result Value Ref Range   Specimen Description BLOOD RIGHT ANTECUBITAL    Special Requests BOTTLES DRAWN AEROBIC AND ANAEROBIC 6CC EACH    Culture NO GROWTH < 12 HOURS    Report Status PENDING   Culture, blood (routine x 2)     Status: None (Preliminary result)   Collection Time: 05/31/15 11:17 PM  Result Value Ref Range   Specimen Description BLOOD LEFT ARM    Special Requests BOTTLES DRAWN AEROBIC AND ANAEROBIC 6CC EACH    Culture NO GROWTH < 12 HOURS    Report Status PENDING   Glucose, capillary     Status: Abnormal   Collection Time: 05/31/15 11:48 PM  Result Value Ref Range   Glucose-Capillary 112 (H) 65 - 99 mg/dL  Basic metabolic panel     Status: Abnormal   Collection Time: 06/01/15  5:06 AM  Result Value Ref Range   Sodium 147 (H) 135 - 145 mmol/L    Comment: RESULT REPEATED AND VERIFIED DELTA CHECK NOTED    Potassium 4.0 3.5 - 5.1 mmol/L    Comment: DELTA CHECK NOTED   Chloride 114 (H) 101 -  111 mmol/L   CO2 26 22 - 32 mmol/L   Glucose, Bld 100 (H) 65 - 99 mg/dL   BUN 41 (H) 6 - 20 mg/dL   Creatinine, Ser 1.79 (H) 0.61 - 1.24 mg/dL   Calcium 8.5 (L) 8.9 - 10.3 mg/dL   GFR calc non Af Amer 30 (L) >60 mL/min   GFR calc Af Amer 35 (L) >60 mL/min    Comment: (NOTE) The eGFR has been calculated using the CKD EPI equation. This calculation has not been validated in all clinical situations. eGFR's persistently <60 mL/min signify possible Chronic Kidney Disease.    Anion gap 7 5 - 15  Glucose, capillary     Status: None   Collection Time: 06/01/15  5:21 AM  Result Value Ref Range   Glucose-Capillary 85 65 - 99 mg/dL  Glucose, capillary     Status: None   Collection Time: 06/01/15  7:19 AM  Result Value Ref Range   Glucose-Capillary 89 65 - 99 mg/dL   Comment 1  Notify RN    Comment 2 Document in Chart   Glucose, capillary     Status: Abnormal   Collection Time: 06/01/15 11:41 AM  Result Value Ref Range   Glucose-Capillary 48 (L) 65 - 99 mg/dL   Comment 1 Notify RN    Comment 2 Document in Chart   Glucose, capillary     Status: Abnormal   Collection Time: 06/01/15 11:43 AM  Result Value Ref Range   Glucose-Capillary 15 (LL) 65 - 99 mg/dL   Comment 1 Notify RN    Comment 2 Document in Chart   Glucose, capillary     Status: Abnormal   Collection Time: 06/01/15 12:14 PM  Result Value Ref Range   Glucose-Capillary 63 (L) 65 - 99 mg/dL   Comment 1 Document in Chart    Comment 2 Repeat Test   Glucose, capillary     Status: None   Collection Time: 06/01/15 12:44 PM  Result Value Ref Range   Glucose-Capillary 76 65 - 99 mg/dL   Comment 1 Notify RN    Comment 2 Document in Chart   Glucose, capillary     Status: Abnormal   Collection Time: 06/01/15  4:00 PM  Result Value Ref Range   Glucose-Capillary 54 (L) 65 - 99 mg/dL   Comment 1 Notify RN    Comment 2 Document in Chart   Glucose, capillary     Status: None   Collection Time: 06/01/15  4:46 PM  Result Value Ref Range   Glucose-Capillary 99 65 - 99 mg/dL   Comment 1 Document in Chart    Comment 2 Repeat Test   Glucose, capillary     Status: Abnormal   Collection Time: 06/01/15  6:04 PM  Result Value Ref Range   Glucose-Capillary 137 (H) 65 - 99 mg/dL  Glucose, capillary     Status: Abnormal   Collection Time: 06/01/15  7:41 PM  Result Value Ref Range   Glucose-Capillary 115 (H) 65 - 99 mg/dL  Glucose, capillary     Status: Abnormal   Collection Time: 06/01/15 10:06 PM  Result Value Ref Range   Glucose-Capillary 130 (H) 65 - 99 mg/dL  Glucose, capillary     Status: Abnormal   Collection Time: 06/01/15 11:52 PM  Result Value Ref Range   Glucose-Capillary 154 (H) 65 - 99 mg/dL  CBC     Status: Abnormal   Collection Time: 06/02/15 12:26 AM  Result Value Ref Range  WBC 20.3 (H)  4.0 - 10.5 K/uL   RBC 4.23 4.22 - 5.81 MIL/uL   Hemoglobin 12.3 (L) 13.0 - 17.0 g/dL   HCT 37.1 (L) 39.0 - 52.0 %   MCV 87.7 78.0 - 100.0 fL   MCH 29.1 26.0 - 34.0 pg   MCHC 33.2 30.0 - 36.0 g/dL   RDW 15.8 (H) 11.5 - 15.5 %   Platelets 80 (L) 150 - 400 K/uL    Comment: RESULT REPEATED AND VERIFIED SPECIMEN CHECKED FOR CLOTS PLATELETS APPEAR DECREASED LARGE PLATELETS PRESENT SMEAR STAINED AND AVAILABLE FOR REVIEW   Basic metabolic panel     Status: Abnormal   Collection Time: 06/02/15 12:26 AM  Result Value Ref Range   Sodium 143 135 - 145 mmol/L   Potassium 3.7 3.5 - 5.1 mmol/L   Chloride 115 (H) 101 - 111 mmol/L   CO2 22 22 - 32 mmol/L   Glucose, Bld 143 (H) 65 - 99 mg/dL   BUN 40 (H) 6 - 20 mg/dL   Creatinine, Ser 1.83 (H) 0.61 - 1.24 mg/dL   Calcium 8.2 (L) 8.9 - 10.3 mg/dL   GFR calc non Af Amer 30 (L) >60 mL/min   GFR calc Af Amer 34 (L) >60 mL/min    Comment: (NOTE) The eGFR has been calculated using the CKD EPI equation. This calculation has not been validated in all clinical situations. eGFR's persistently <60 mL/min signify possible Chronic Kidney Disease.    Anion gap 6 5 - 15  Glucose, capillary     Status: Abnormal   Collection Time: 06/02/15  4:16 AM  Result Value Ref Range   Glucose-Capillary 128 (H) 65 - 99 mg/dL  Basic metabolic panel     Status: Abnormal   Collection Time: 06/02/15  4:33 AM  Result Value Ref Range   Sodium 144 135 - 145 mmol/L   Potassium 3.6 3.5 - 5.1 mmol/L   Chloride 114 (H) 101 - 111 mmol/L   CO2 22 22 - 32 mmol/L   Glucose, Bld 136 (H) 65 - 99 mg/dL   BUN 39 (H) 6 - 20 mg/dL   Creatinine, Ser 1.77 (H) 0.61 - 1.24 mg/dL   Calcium 8.2 (L) 8.9 - 10.3 mg/dL   GFR calc non Af Amer 31 (L) >60 mL/min   GFR calc Af Amer 36 (L) >60 mL/min    Comment: (NOTE) The eGFR has been calculated using the CKD EPI equation. This calculation has not been validated in all clinical situations. eGFR's persistently <60 mL/min signify possible  Chronic Kidney Disease.    Anion gap 8 5 - 15  Glucose, capillary     Status: Abnormal   Collection Time: 06/02/15  7:19 AM  Result Value Ref Range   Glucose-Capillary 127 (H) 65 - 99 mg/dL   Comment 1 Notify RN    Comment 2 Document in Chart     ABGS  Recent Labs  05/31/15 0525  PHART 7.381  PO2ART 89.4  TCO2 20.7  HCO3 23.1   CULTURES Recent Results (from the past 240 hour(s))  Urine culture     Status: None   Collection Time: 05/28/15  4:40 PM  Result Value Ref Range Status   Specimen Description URINE, CATHETERIZED  Final   Special Requests NONE  Final   Culture   Final    20,000 COLONIES/mL STAPHYLOCOCCUS SPECIES (COAGULASE NEGATIVE) Performed at [facility]    Report Status 05/31/2015 FINAL  Final   Organism ID, Bacteria STAPHYLOCOCCUS SPECIES (  COAGULASE NEGATIVE)  Final      Susceptibility   Staphylococcus species (coagulase negative) - MIC*    CIPROFLOXACIN <=0.5 SENSITIVE Sensitive     GENTAMICIN <=0.5 SENSITIVE Sensitive     NITROFURANTOIN <=16 SENSITIVE Sensitive     OXACILLIN <=0.25 SENSITIVE Sensitive     TETRACYCLINE 2 SENSITIVE Sensitive     VANCOMYCIN <=0.5 SENSITIVE Sensitive     TRIMETH/SULFA <=10 SENSITIVE Sensitive     CLINDAMYCIN 0.5 SENSITIVE Sensitive     RIFAMPIN <=0.5 SENSITIVE Sensitive     Inducible Clindamycin NEGATIVE Sensitive     * 20,000 COLONIES/mL STAPHYLOCOCCUS SPECIES (COAGULASE NEGATIVE)  MRSA PCR Screening     Status: None   Collection Time: 05/28/15 11:52 PM  Result Value Ref Range Status   MRSA by PCR NEGATIVE NEGATIVE Final    Comment:        The GeneXpert MRSA Assay (FDA approved for NASAL specimens only), is one component of a comprehensive MRSA colonization surveillance program. It is not intended to diagnose MRSA infection nor to guide or monitor treatment for MRSA infections.   Culture, blood (routine x 2)     Status: None (Preliminary result)   Collection Time: 05/31/15 11:10 PM  Result Value Ref  Range Status   Specimen Description BLOOD RIGHT ANTECUBITAL  Final   Special Requests BOTTLES DRAWN AEROBIC AND ANAEROBIC 6CC EACH  Final   Culture NO GROWTH < 12 HOURS  Final   Report Status PENDING  Incomplete  Culture, blood (routine x 2)     Status: None (Preliminary result)   Collection Time: 05/31/15 11:17 PM  Result Value Ref Range Status   Specimen Description BLOOD LEFT ARM  Final   Special Requests BOTTLES DRAWN AEROBIC AND ANAEROBIC 6CC EACH  Final   Culture NO GROWTH < 12 HOURS  Final   Report Status PENDING  Incomplete   Studies/Results: No results found.  Medications:  Prior to Admission:  Prescriptions prior to admission  Medication Sig Dispense Refill Last Dose  . polyethylene glycol (MIRALAX / GLYCOLAX) packet Take 17 g by mouth daily. 14 each 0 05/28/2015 at Unknown time   Scheduled: . albuterol  2.5 mg Nebulization Q6H  . antiseptic oral rinse  7 mL Mouth Rinse q12n4p  . antiseptic oral rinse  7 mL Mouth Rinse QID  . chlorhexidine  15 mL Mouth Rinse BID  . clindamycin (CLEOCIN) IV  600 mg Intravenous Q8H  . enoxaparin (LOVENOX) injection  30 mg Subcutaneous Q24H  . feeding supplement (PRO-STAT SUGAR FREE 64)  30 mL Per Tube Daily  . feeding supplement (VITAL AF 1.2 CAL)  1,000 mL Per Tube Q24H  . fentaNYL (SUBLIMAZE) injection  50 mcg Intravenous Once  . metoCLOPramide (REGLAN) injection  5 mg Intravenous 4 times per day  . metoprolol tartrate  50 mg Oral BID  . pantoprazole (PROTONIX) IV  40 mg Intravenous Q24H   Continuous: . dextrose 5 % and 0.45% NaCl 125 mL/hr at 06/01/15 2242  . fentaNYL infusion INTRAVENOUS 50 mcg/hr (06/02/15 0200)   GHW:EXHBZJIRCVELF (TYLENOL) oral liquid 160 mg/5 mL, acetaminophen **OR** acetaminophen, fentaNYL, fentaNYL (SUBLIMAZE) injection, fentaNYL (SUBLIMAZE) injection, hydrALAZINE, midazolam, midazolam, ondansetron **OR** ondansetron (ZOFRAN) IV  Assesment: He has acute respiratory failure related to aspiration pneumonia. He  is being treated for that. He has a Staphylococcus UTI which is being treated also.  At baseline he has dementia. This is at least moderately severe and he does have swallowing issues from that  He had hypernatremia  which is much improved.  He had acute renal failure which is much improved  He has severe protein calorie malnutrition and he still not tolerating his tube feedings very well Active Problems:   Leukocytosis   Hypertension   Hypernatremia   ARF (acute renal failure)    Plan: Discussed by phone with his son Binnie Kand. I told him what the situation was and he understands. He does not want him reintubated for able to get the endotracheal tube out. He does not want him to be resuscitated. I will request palliative care consult. See how he does with weaning process    LOS: 5 days   Leenah Seidner L 06/02/2015, 7:48 AM

## 2015-06-03 DIAGNOSIS — J9600 Acute respiratory failure, unspecified whether with hypoxia or hypercapnia: Secondary | ICD-10-CM | POA: Diagnosis not present

## 2015-06-03 LAB — GLUCOSE, CAPILLARY
GLUCOSE-CAPILLARY: 93 mg/dL (ref 65–99)
GLUCOSE-CAPILLARY: 89 mg/dL (ref 65–99)
Glucose-Capillary: 116 mg/dL — ABNORMAL HIGH (ref 65–99)
Glucose-Capillary: 107 mg/dL — ABNORMAL HIGH (ref 65–99)
Glucose-Capillary: 96 mg/dL (ref 65–99)

## 2015-06-03 LAB — BASIC METABOLIC PANEL
Anion gap: 7 (ref 5–15)
BUN: 35 mg/dL — ABNORMAL HIGH (ref 6–20)
CO2: 22 mmol/L (ref 22–32)
Calcium: 8.2 mg/dL — ABNORMAL LOW (ref 8.9–10.3)
Chloride: 118 mmol/L — ABNORMAL HIGH (ref 101–111)
Creatinine, Ser: 1.71 mg/dL — ABNORMAL HIGH (ref 0.61–1.24)
GFR calc Af Amer: 37 mL/min — ABNORMAL LOW (ref >60)
GFR, EST NON AFRICAN AMERICAN: 32 mL/min — AB (ref >60)
Glucose, Bld: 119 mg/dL — ABNORMAL HIGH (ref 65–99)
Potassium: 3.5 mmol/L (ref 3.5–5.1)
Sodium: 147 mmol/L — ABNORMAL HIGH (ref 135–145)

## 2015-06-03 LAB — URINE CULTURE
Culture: NO GROWTH
Special Requests: NORMAL

## 2015-06-03 MED ORDER — CHLORHEXIDINE GLUCONATE 0.12 % MT SOLN: 15.0000 mL | Freq: Two times a day (BID) | OROMUCOSAL | Status: DC

## 2015-06-03 MED ORDER — DEXTROSE 5 % IV SOLN
INTRAVENOUS | Status: DC
  Administered 2015-06-03 – 2015-06-04 (×2): via INTRAVENOUS

## 2015-06-03 MED ORDER — CETYLPYRIDINIUM CHLORIDE 0.05 % MT LIQD
7.0000 mL | Freq: Two times a day (BID) | OROMUCOSAL | Status: DC
  Administered 2015-06-03: 7 mL via OROMUCOSAL

## 2015-06-03 NOTE — Progress Notes (Signed)
Nutrition Note: Follow Up  DOCUMENTATION CODES:  Severe malnutrition in context of chronic illness, Underweight  INTERVENTION:   Magic cup TID with meals, each supplement provides 290 kcal and 9 grams of protein   Staff assist with feeding   NUTRITION DIAGNOSIS:  Inadequate oral intake related to chronic illness as evidenced by severe depletion of body fat, severe depletion of muscle mass.  GOAL:  Pt will consume food and beverage as desired with gentle encouragement.  MONITOR:  Tolerance of oral intake, labs, wt trends   ASSESSMENT:  Pt weaned from vent this morning and  has been evaluated by ST. Diet advanced to Dysphagia 1 /Nectar-thick liquids. His estimated needs re-assessed with change in status.  (8/2) Labs: sodium 147, BUN - 35 Cr.1.71, Cl: 118,  glucose 119 mg/dl  Diet Order:  DIET - DYS 1 Room service appropriate?: Yes; Fluid consistency:: Nectar Thick  Skin:   WDL  Last BM:   7/29  Height:  Ht Readings from Last 1 Encounters:  06/01/15 5\' 2"  (1.575 m)   Weight:  Wt Readings from Last 1 Encounters:  06/03/15 107 lb 9.4 oz (48.8 kg)    Ideal Body Weight:  53.6 kg  BMI:  Body mass index is 19.67 kg/(m^2).   Estimated Nutritional Needs:  Kcal:  1600-1715 kcal Protein:  50-60 gr Fluid:  1300 ml daily  EDUCATION NEEDS:  Education needs no appropriate at this time  Colman Cater MS,RD,CSG,LDN Office: [phone] Pager: [phone]

## 2015-06-03 NOTE — Progress Notes (Signed)
Subjective: He remains intubated and on the ventilator. Palliative care note is seen and appreciated.  Objective: Vital signs in last 24 hours: Temp:  [99.5 F (37.5 C)-99.9 F (37.7 C)] 99.6 F (37.6 C) (08/02 0756) Pulse Rate:  [65-96] 80 (08/02 0800) Resp:  [10-30] 14 (08/02 0800) BP: (104-193)/(52-87) 124/71 mmHg (08/02 0800) SpO2:  [92 %-100 %] 100 % (08/02 0800) FiO2 (%):  [40 %] 40 % (08/02 0740) Weight:  [48.8 kg (107 lb 9.4 oz)] 48.8 kg (107 lb 9.4 oz) (08/02 0500) Weight change: 0.2 kg (7.1 oz) Last BM Date: 05/30/15  Intake/Output from previous day: 08/01 0701 - 08/02 0700 In: 2562.4 [I.V.:2162.1; NG/GT:350.3; IV Piggyback:50] Out: 6160 [Urine:1475]  PHYSICAL EXAM General appearance: Intubated sedated on mechanical ventilator Resp: rhonchi bilaterally Cardio: regular rate and rhythm, S1, S2 normal, no murmur, click, rub or gallop GI: soft, non-tender; bowel sounds normal; no masses,  no organomegaly Extremities: extremities normal, atraumatic, no cyanosis or edema  Lab Results:  Results for orders placed or performed during the hospital encounter of 05/28/15 (from the past 48 hour(s))  Glucose, capillary     Status: Abnormal   Collection Time: 06/01/15 11:41 AM  Result Value Ref Range   Glucose-Capillary 48 (L) 65 - 99 mg/dL   Comment 1 Notify RN    Comment 2 Document in Chart   Glucose, capillary     Status: Abnormal   Collection Time: 06/01/15 11:43 AM  Result Value Ref Range   Glucose-Capillary 15 (LL) 65 - 99 mg/dL   Comment 1 Notify RN    Comment 2 Document in Chart   Glucose, capillary     Status: Abnormal   Collection Time: 06/01/15 12:14 PM  Result Value Ref Range   Glucose-Capillary 63 (L) 65 - 99 mg/dL   Comment 1 Document in Chart    Comment 2 Repeat Test   Glucose, capillary     Status: None   Collection Time: 06/01/15 12:44 PM  Result Value Ref Range   Glucose-Capillary 76 65 - 99 mg/dL   Comment 1 Notify RN    Comment 2 Document in  Chart   Glucose, capillary     Status: Abnormal   Collection Time: 06/01/15  4:00 PM  Result Value Ref Range   Glucose-Capillary 54 (L) 65 - 99 mg/dL   Comment 1 Notify RN    Comment 2 Document in Chart   Glucose, capillary     Status: None   Collection Time: 06/01/15  4:46 PM  Result Value Ref Range   Glucose-Capillary 99 65 - 99 mg/dL   Comment 1 Document in Chart    Comment 2 Repeat Test   Glucose, capillary     Status: Abnormal   Collection Time: 06/01/15  6:04 PM  Result Value Ref Range   Glucose-Capillary 137 (H) 65 - 99 mg/dL  Glucose, capillary     Status: Abnormal   Collection Time: 06/01/15  7:41 PM  Result Value Ref Range   Glucose-Capillary 115 (H) 65 - 99 mg/dL  Glucose, capillary     Status: Abnormal   Collection Time: 06/01/15 10:06 PM  Result Value Ref Range   Glucose-Capillary 130 (H) 65 - 99 mg/dL  Glucose, capillary     Status: Abnormal   Collection Time: 06/01/15 11:52 PM  Result Value Ref Range   Glucose-Capillary 154 (H) 65 - 99 mg/dL  CBC     Status: Abnormal   Collection Time: 06/02/15 12:26 AM  Result Value Ref Range  WBC 20.3 (H) 4.0 - 10.5 K/uL   RBC 4.23 4.22 - 5.81 MIL/uL   Hemoglobin 12.3 (L) 13.0 - 17.0 g/dL   HCT 37.1 (L) 39.0 - 52.0 %   MCV 87.7 78.0 - 100.0 fL   MCH 29.1 26.0 - 34.0 pg   MCHC 33.2 30.0 - 36.0 g/dL   RDW 15.8 (H) 11.5 - 15.5 %   Platelets 80 (L) 150 - 400 K/uL    Comment: RESULT REPEATED AND VERIFIED SPECIMEN CHECKED FOR CLOTS PLATELETS APPEAR DECREASED LARGE PLATELETS PRESENT SMEAR STAINED AND AVAILABLE FOR REVIEW   Basic metabolic panel     Status: Abnormal   Collection Time: 06/02/15 12:26 AM  Result Value Ref Range   Sodium 143 135 - 145 mmol/L   Potassium 3.7 3.5 - 5.1 mmol/L   Chloride 115 (H) 101 - 111 mmol/L   CO2 22 22 - 32 mmol/L   Glucose, Bld 143 (H) 65 - 99 mg/dL   BUN 40 (H) 6 - 20 mg/dL   Creatinine, Ser 1.83 (H) 0.61 - 1.24 mg/dL   Calcium 8.2 (L) 8.9 - 10.3 mg/dL   GFR calc non Af Amer 30 (L)  >60 mL/min   GFR calc Af Amer 34 (L) >60 mL/min    Comment: (NOTE) The eGFR has been calculated using the CKD EPI equation. This calculation has not been validated in all clinical situations. eGFR's persistently <60 mL/min signify possible Chronic Kidney Disease.    Anion gap 6 5 - 15  Glucose, capillary     Status: Abnormal   Collection Time: 06/02/15  4:16 AM  Result Value Ref Range   Glucose-Capillary 128 (H) 65 - 99 mg/dL  Basic metabolic panel     Status: Abnormal   Collection Time: 06/02/15  4:33 AM  Result Value Ref Range   Sodium 144 135 - 145 mmol/L   Potassium 3.6 3.5 - 5.1 mmol/L   Chloride 114 (H) 101 - 111 mmol/L   CO2 22 22 - 32 mmol/L   Glucose, Bld 136 (H) 65 - 99 mg/dL   BUN 39 (H) 6 - 20 mg/dL   Creatinine, Ser 1.77 (H) 0.61 - 1.24 mg/dL   Calcium 8.2 (L) 8.9 - 10.3 mg/dL   GFR calc non Af Amer 31 (L) >60 mL/min   GFR calc Af Amer 36 (L) >60 mL/min    Comment: (NOTE) The eGFR has been calculated using the CKD EPI equation. This calculation has not been validated in all clinical situations. eGFR's persistently <60 mL/min signify possible Chronic Kidney Disease.    Anion gap 8 5 - 15  Glucose, capillary     Status: Abnormal   Collection Time: 06/02/15  7:19 AM  Result Value Ref Range   Glucose-Capillary 127 (H) 65 - 99 mg/dL   Comment 1 Notify RN    Comment 2 Document in Chart   Blood gas, arterial     Status: Abnormal   Collection Time: 06/02/15 10:09 AM  Result Value Ref Range   FIO2 40.00    Delivery systems VENTILATOR    Mode CONTINUOUS POSITIVE AIRWAY PRESSURE    Peep/cpap 5.0 cm H20   Pressure support 5 cm H20   pH, Arterial 7.344 (L) 7.350 - 7.450   pCO2 arterial 42.5 35.0 - 45.0 mmHg   pO2, Arterial 104 (H) 80.0 - 100.0 mmHg   Bicarbonate 22.6 20.0 - 24.0 mEq/L   TCO2 20.1 0 - 100 mmol/L   Acid-base deficit 2.6 (H) 0.0 - 2.0  mmol/L   O2 Saturation 97.9 %   Collection site RIGHT RADIAL    Drawn by [id number]    Sample type ARTHROGRAPHIS  SPECIES    Allens test (pass/fail) PASS PASS  Glucose, capillary     Status: Abnormal   Collection Time: 06/02/15 11:19 AM  Result Value Ref Range   Glucose-Capillary 126 (H) 65 - 99 mg/dL   Comment 1 Notify RN    Comment 2 Document in Chart   Glucose, capillary     Status: Abnormal   Collection Time: 06/02/15  4:32 PM  Result Value Ref Range   Glucose-Capillary 125 (H) 65 - 99 mg/dL   Comment 1 Notify RN    Comment 2 Document in Chart   Glucose, capillary     Status: Abnormal   Collection Time: 06/03/15  3:46 AM  Result Value Ref Range   Glucose-Capillary 116 (H) 65 - 99 mg/dL  Basic metabolic panel     Status: Abnormal   Collection Time: 06/03/15  4:32 AM  Result Value Ref Range   Sodium 147 (H) 135 - 145 mmol/L   Potassium 3.5 3.5 - 5.1 mmol/L   Chloride 118 (H) 101 - 111 mmol/L   CO2 22 22 - 32 mmol/L   Glucose, Bld 119 (H) 65 - 99 mg/dL   BUN 35 (H) 6 - 20 mg/dL   Creatinine, Ser 1.71 (H) 0.61 - 1.24 mg/dL   Calcium 8.2 (L) 8.9 - 10.3 mg/dL   GFR calc non Af Amer 32 (L) >60 mL/min   GFR calc Af Amer 37 (L) >60 mL/min    Comment: (NOTE) The eGFR has been calculated using the CKD EPI equation. This calculation has not been validated in all clinical situations. eGFR's persistently <60 mL/min signify possible Chronic Kidney Disease.    Anion gap 7 5 - 15  Glucose, capillary     Status: Abnormal   Collection Time: 06/03/15  7:36 AM  Result Value Ref Range   Glucose-Capillary 107 (H) 65 - 99 mg/dL   Comment 1 Notify RN    Comment 2 Document in Chart     ABGS  Recent Labs  06/02/15 1009  PHART 7.344*  PO2ART 104*  TCO2 20.1  HCO3 22.6   CULTURES Recent Results (from the past 240 hour(s))  Urine culture     Status: None   Collection Time: 05/28/15  4:40 PM  Result Value Ref Range Status   Specimen Description URINE, CATHETERIZED  Final   Special Requests NONE  Final   Culture   Final    20,000 COLONIES/mL STAPHYLOCOCCUS SPECIES (COAGULASE  NEGATIVE) Performed at [facility]    Report Status 05/31/2015 FINAL  Final   Organism ID, Bacteria STAPHYLOCOCCUS SPECIES (COAGULASE NEGATIVE)  Final      Susceptibility   Staphylococcus species (coagulase negative) - MIC*    CIPROFLOXACIN <=0.5 SENSITIVE Sensitive     GENTAMICIN <=0.5 SENSITIVE Sensitive     NITROFURANTOIN <=16 SENSITIVE Sensitive     OXACILLIN <=0.25 SENSITIVE Sensitive     TETRACYCLINE 2 SENSITIVE Sensitive     VANCOMYCIN <=0.5 SENSITIVE Sensitive     TRIMETH/SULFA <=10 SENSITIVE Sensitive     CLINDAMYCIN 0.5 SENSITIVE Sensitive     RIFAMPIN <=0.5 SENSITIVE Sensitive     Inducible Clindamycin NEGATIVE Sensitive     * 20,000 COLONIES/mL STAPHYLOCOCCUS SPECIES (COAGULASE NEGATIVE)  MRSA PCR Screening     Status: None   Collection Time: 05/28/15 11:52 PM  Result Value Ref Range Status  MRSA by PCR NEGATIVE NEGATIVE Final    Comment:        The GeneXpert MRSA Assay (FDA approved for NASAL specimens only), is one component of a comprehensive MRSA colonization surveillance program. It is not intended to diagnose MRSA infection nor to guide or monitor treatment for MRSA infections.   Culture, Urine     Status: None (Preliminary result)   Collection Time: 05/31/15 10:54 PM  Result Value Ref Range Status   Specimen Description URINE, CATHETERIZED  Final   Special Requests Normal  Final   Culture   Final    NO GROWTH < 24 HOURS Performed at [facility]    Report Status PENDING  Incomplete  Culture, respiratory (NON-Expectorated)     Status: None (Preliminary result)   Collection Time: 05/31/15 10:54 PM  Result Value Ref Range Status   Specimen Description TRACHEAL ASPIRATE  Final   Special Requests NONE  Final   Gram Stain   Final    ABUNDANT WBC PRESENT,BOTH PMN AND MONONUCLEAR RARE SQUAMOUS EPITHELIAL CELLS PRESENT NO ORGANISMS SEEN Performed at [facility]    Culture NO GROWTH Performed at [facility]   Final    Report Status PENDING  Incomplete  Culture, blood (routine x 2)     Status: None (Preliminary result)   Collection Time: 05/31/15 11:10 PM  Result Value Ref Range Status   Specimen Description BLOOD RIGHT ANTECUBITAL  Final   Special Requests BOTTLES DRAWN AEROBIC AND ANAEROBIC 6CC EACH  Final   Culture NO GROWTH 2 DAYS  Final   Report Status PENDING  Incomplete  Culture, blood (routine x 2)     Status: None (Preliminary result)   Collection Time: 05/31/15 11:17 PM  Result Value Ref Range Status   Specimen Description BLOOD LEFT ARM  Final   Special Requests BOTTLES DRAWN AEROBIC AND ANAEROBIC [place]  Final   Culture NO GROWTH 2 DAYS  Final   Report Status PENDING  Incomplete   Studies/Results: No results found.  Medications:  Prior to Admission:  Prescriptions prior to admission  Medication Sig Dispense Refill Last Dose  . polyethylene glycol (MIRALAX / GLYCOLAX) packet Take 17 g by mouth daily. 14 each 0 05/28/2015 at Unknown time   Scheduled: . albuterol  2.5 mg Nebulization Q6H  . antiseptic oral rinse  7 mL Mouth Rinse q12n4p  . antiseptic oral rinse  7 mL Mouth Rinse QID  . chlorhexidine  15 mL Mouth Rinse BID  . clindamycin (CLEOCIN) IV  600 mg Intravenous Q8H  . enoxaparin (LOVENOX) injection  30 mg Subcutaneous Q24H  . feeding supplement (PRO-STAT SUGAR FREE 64)  30 mL Per Tube Daily  . feeding supplement (VITAL AF 1.2 CAL)  1,000 mL Per Tube Q24H  . fentaNYL (SUBLIMAZE) injection  50 mcg Intravenous Once  . metoCLOPramide (REGLAN) injection  5 mg Intravenous 4 times per day  . metoprolol tartrate  50 mg Oral BID  . pantoprazole (PROTONIX) IV  40 mg Intravenous Q24H   Continuous: . dextrose 75 mL/hr at 06/03/15 0803  . fentaNYL infusion INTRAVENOUS 25 mcg/hr (06/02/15 2100)   ZLD:JTTSVXBLTJQZE (TYLENOL) oral liquid 160 mg/5 mL, acetaminophen **OR** acetaminophen, fentaNYL, fentaNYL (SUBLIMAZE) injection, fentaNYL (SUBLIMAZE) injection, hydrALAZINE, midazolam,  midazolam, ondansetron **OR** ondansetron (ZOFRAN) IV  Assesment: He was admitted with failure to thrive and acute renal failure. Since then he has had aspiration and acute respiratory failure requiring ventilator support. He is improved this morning and I think he  is ready to come off the ventilator. After discussion with family they do not want him reintubated if he runs into trouble.  He is still having a significant amount of secretions.  His acute renal failure is better  His hypernatremia is much better Active Problems:   Leukocytosis   Hypertension   Hypernatremia   ARF (acute renal failure)   Aspiration into airway   FTT (failure to thrive) in adult   Palliative care encounter   DNR (do not resuscitate) discussion    Plan: Extubate today    LOS: 6 days   Ludella Pranger L 06/03/2015, 8:43 AM

## 2015-06-03 NOTE — Procedures (Signed)
Extubation Procedure Note  Patient Details:   Name: ASHUR GLATFELTER DOB: 03-12-1916 MRN: [id number]   Airway Documentation:  Airway (Active)  Secured at (cm) 24 cm 06/03/2015  7:40 AM  Measured From Lips 06/03/2015  7:40 AM  [place] 06/03/2015  7:40 AM  Secured By [employer] 06/03/2015  7:40 AM  Tube Holder Repositioned Yes 06/03/2015  7:40 AM  Cuff Pressure (cm H2O) 26 cm H2O 06/02/2015  7:16 PM  Site Condition Dry 06/03/2015  7:40 AM   Patient placed on 4 lpm nasal cannula, sats 100%, will continue to monitor.  Evaluation  O2 sats: stable throughout Complications: No apparent complications Patient did tolerate procedure well. Bilateral Breath Sounds: Diminished Suctioning: Airway Yes  Oren Bracket 06/03/2015, 8:49 AM

## 2015-06-03 NOTE — Evaluation (Signed)
Clinical/Bedside Swallow Evaluation Patient Details  Name: Jerry [name] MRN: [id number] Date of Birth: 1916/06/20  Today's Date: 06/03/2015 Time: SLP Start Time (ACUTE ONLY): 2774 SLP Stop Time (ACUTE ONLY): 1415 SLP Time Calculation (min) (ACUTE ONLY): 30 min  Past Medical History:  Past Medical History  Diagnosis Date  . Cancer   . Prostate cancer   . Alzheimer's dementia   . Stroke   . FTT (failure to thrive) in adult   . Somnolence 12/2014    due to dehydration   Past Surgical History:  Past Surgical History  Procedure Laterality Date  . Hip pinning,cannulated  10/18/2012    Procedure: CANNULATED HIP PINNING;  Surgeon: Sanjuana Kava, MD;  Location: [facility];  Service: Orthopedics;  Laterality: Left;  [place] Left Hip Pinning    HPI:  81 year old male whohas a past medical history of Cancer; Prostate cancer; Alzheimer's dementia; Stroke; FTT (failure to thrive) in adult; and Somnolence (12/2014). Patient sent from skilled facility for lethargy.  Patient was sent from the skilled facility for poor by mouth intake for past several days. And has been feeling extremely weak. Patient has a history of dementia, though is alert but not able to provide any significant history. Recent chest xray shows: Mild bibasilar opacities, likely atelectasis. He was just extubated this AM. Palliative care has been consulted. SLP asked to evaluate swallow for appropriate diet. Family does not wish to pursue feeding tubes.    Assessment / Plan / Recommendation Clinical Impression  Suspected pharyngeal phase dysphagia in setting of dementia, decreased respiratory support, and weakness characterized by weak lingual manipulation, suspected delay in swallow initiation, decreased airway protection resulting in immediate and delayed cough after repeated trials thin (cup and straw). Pt with improved control with puree and nectars. Acknowlege palliative care consult. Recommend initiating D1/puree with nectars via  cup or spoon. No straws. PO meds crushed as able in puree. Above d/w pt's son and recommendations left in room. SLP will follow.    Aspiration Risk  Moderate    Diet Recommendation Dysphagia 1 (Puree);Nectar   Medication Administration: Crushed with puree Compensations: Slow rate;Small sips/bites    Other  Recommendations Oral Care Recommendations: Oral care BID;Staff/trained caregiver to provide oral care Other Recommendations: Order thickener from pharmacy;Clarify dietary restrictions   Follow Up Recommendations       Frequency and Duration min 1 x/week  1 week   Pertinent Vitals/Pain     SLP Swallow Goals   Pt will demonstrate safe and efficient consumption of least restrictive diet with use of strategies as needed.    Swallow Study Prior Functional Status   [name]    General Date of Onset: 05/28/15 Other Pertinent Information: 81 year old male whohas a past medical history of Cancer; Prostate cancer; Alzheimer's dementia; Stroke; FTT (failure to thrive) in adult; and Somnolence (12/2014). Patient sent from skilled facility for lethargy.  Patient was sent from the skilled facility for poor by mouth intake for past several days. And has been feeling extremely weak. Patient has a history of dementia, though is alert but not able to provide any significant history. Recent chest xray shows: Mild bibasilar opacities, likely atelectasis. He was just extubated this AM. Palliative care has been consulted. SLP asked to evaluate swallow for appropriate diet. Family does not wish to pursue feeding tubes.  Type of Study: Bedside swallow evaluation Previous Swallow Assessment: None on record Diet Prior to this Study: NPO Temperature Spikes Noted: No Respiratory Status: Supplemental O2 delivered via (  comment) History of Recent Intubation: Yes Date extubated: 06/03/15 Behavior/Cognition: Cooperative;Pleasant mood;Lethargic/Drowsy Oral Cavity - Dentition: Adequate natural  dentition/normal for age;Poor condition Self-Feeding Abilities: Needs assist Patient Positioning: Upright in bed Baseline Vocal Quality: Wet;Hoarse Volitional Cough: Weak;Congested Volitional Swallow: Able to elicit    Oral/Motor/Sensory Function Overall Oral Motor/Sensory Function: Appears within functional limits for tasks assessed (no asymmetry, global weakness) Labial ROM: Within Functional Limits Labial Symmetry: Within Functional Limits Labial Sensation: Within Functional Limits Lingual Symmetry: Within Functional Limits Lingual Strength: Reduced Lingual Sensation: Within Functional Limits Facial ROM: Within Functional Limits Facial Symmetry: Within Functional Limits Velum:  (CNT) Mandible: Within Functional Limits   Ice Chips Ice chips: Within functional limits Presentation: Spoon   Thin Liquid Thin Liquid: Impaired Presentation: Cup;Straw Pharyngeal  Phase Impairments: Suspected delayed Swallow;Decreased hyoid-laryngeal movement;Cough - Immediate;Cough - Delayed    Nectar Thick Nectar Thick Liquid: Within functional limits Presentation: Cup   Honey Thick Honey Thick Liquid: Not tested   Puree Puree: Within functional limits Presentation: Spoon   Solid   Thank you,  Genene Churn, [place]     Solid: Not tested       Adaleen Hulgan 06/03/2015,3:41 PM

## 2015-06-03 NOTE — Care Management Note (Signed)
Case Management Note  Patient Details  Name: MONTAY VANVOORHIS MRN: [id number] Date of Birth: 1916-01-07   Expected Discharge Date:                  Expected Discharge Plan:  Assisted Living / Rest Home  In-House Referral:  Clinical Social Work  Discharge planning Services  CM Consult  Post Acute Care Choice:  NA Choice offered to:  NA  DME Arranged:    DME Agency:     HH Arranged:    House Agency:     Status of Service:  Completed, signed off  Medicare Important Message Given:  Yes-second notification given Date Medicare IM Given:    Medicare IM give by:    Date Additional Medicare IM Given:    Additional Medicare Important Message give by:     If discussed at [place] of Stay Meetings, dates discussed:  06/03/2015  Additional Comments: Patient extubated today.  Sherald Barge, RN 06/03/2015, 1:09 PM

## 2015-06-03 NOTE — Progress Notes (Signed)
Subjective: none  Objective: Vital signs in last 24 hours: Temp:  [97.2 F (36.2 C)-99.9 F (37.7 C)] 99.5 F (37.5 C) (08/01 2000) Pulse Rate:  [65-96] 79 (08/02 0700) Resp:  [10-30] 14 (08/02 0700) BP: (104-193)/(52-87) 139/63 mmHg (08/02 0700) SpO2:  [92 %-100 %] 100 % (08/02 0700) FiO2 (%):  [40 %] 40 % (08/02 0315) Weight:  [107 lb 9.4 oz (48.8 kg)] 107 lb 9.4 oz (48.8 kg) (08/02 0500)  Intake/Output from previous day: 08/01 0701 - 08/02 0700 In: 2562.4 [I.V.:2162.1; NG/GT:350.3; IV Piggyback:50] Out: 3903 [Urine:1475] Intake/Output this shift:     Recent Labs  06/02/15 0026  HGB 12.3*    Recent Labs  06/02/15 0026  WBC 20.3*  RBC 4.23  HCT 37.1*  PLT 80*    Recent Labs  06/02/15 0433 06/03/15 0432  NA 144 147*  K 3.6 3.5  CL 114* 118*  CO2 22 22  BUN 39* 35*  CREATININE 1.77* 1.71*  GLUCOSE 136* 119*  CALCIUM 8.2* 8.2*   No results for input(s): LABPT, INR in the last 72 hours.  Patient remained  intubated and sedated. No sigignicant change Chest : Decreased breath sound have some inspiratory rhonchi. Heart exam regular rate and rhythm no murmur Extremities no edema   Assessment/Plan: Problem #1 hypernatremia: His sodium started increasing . Patient on tube feeding and d51/2ns Problem #2 acute kidney injury: His renal function is continue to improve and no sign of fluid over load Problem #3 hypokalemia: His potasium is normal Problem #4 Patient remained intubated. Presently he isDNR Problem #5 hypertension: Patient with occassional hypotension. Problem #6 UTI Problem # 7 dementia Plan:1] Change his ivf to D5w at 75 cc/hr 3] will check his basic metabolic panel in the morning.   Jerry [name] S 06/03/2015, 7:30 AM

## 2015-06-04 ENCOUNTER — Inpatient Hospital Stay (HOSPITAL_COMMUNITY)

## 2015-06-04 DIAGNOSIS — F0390 Unspecified dementia without behavioral disturbance: Secondary | ICD-10-CM | POA: Diagnosis present

## 2015-06-04 LAB — BASIC METABOLIC PANEL
Anion gap: 10 (ref 5–15)
BUN: 35 mg/dL — ABNORMAL HIGH (ref 6–20)
CO2: 19 mmol/L — AB (ref 22–32)
Calcium: 8.4 mg/dL — ABNORMAL LOW (ref 8.9–10.3)
Chloride: 116 mmol/L — ABNORMAL HIGH (ref 101–111)
Creatinine, Ser: 1.7 mg/dL — ABNORMAL HIGH (ref 0.61–1.24)
GFR calc non Af Amer: 32 mL/min — ABNORMAL LOW (ref >60)
GFR, EST AFRICAN AMERICAN: 37 mL/min — AB (ref >60)
GLUCOSE: 103 mg/dL — AB (ref 65–99)
Potassium: 3.4 mmol/L — ABNORMAL LOW (ref 3.5–5.1)
Sodium: 145 mmol/L (ref 135–145)

## 2015-06-04 LAB — CULTURE, RESPIRATORY

## 2015-06-04 LAB — GLUCOSE, CAPILLARY
GLUCOSE-CAPILLARY: 109 mg/dL — AB (ref 65–99)
Glucose-Capillary: 108 mg/dL — ABNORMAL HIGH (ref 65–99)
Glucose-Capillary: 89 mg/dL (ref 65–99)
Glucose-Capillary: 90 mg/dL (ref 65–99)
Glucose-Capillary: 92 mg/dL (ref 65–99)
Glucose-Capillary: 96 mg/dL (ref 65–99)

## 2015-06-04 LAB — CULTURE, RESPIRATORY W GRAM STAIN

## 2015-06-04 MED ORDER — POTASSIUM CL IN DEXTROSE 5% 20 MEQ/L IV SOLN
20.0000 meq | INTRAVENOUS | Status: DC
  Administered 2015-06-04: 20 meq via INTRAVENOUS

## 2015-06-04 MED ORDER — METOCLOPRAMIDE HCL 10 MG PO TABS
5.0000 mg | ORAL_TABLET | Freq: Three times a day (TID) | ORAL | Status: DC
  Administered 2015-06-04: 5 mg via ORAL
  Filled 2015-06-04: qty 1

## 2015-06-04 MED ORDER — ALBUTEROL SULFATE (2.5 MG/3ML) 0.083% IN NEBU: 2.5000 mg | INHALATION_SOLUTION | Freq: Four times a day (QID) | RESPIRATORY_TRACT | Status: DC | PRN

## 2015-06-04 MED ORDER — POLYETHYLENE GLYCOL 3350 17 G PO PACK
17.0000 g | PACK | Freq: Every day | ORAL | Status: DC
  Administered 2015-06-04: 17 g via ORAL
  Filled 2015-06-04: qty 1

## 2015-06-04 MED ORDER — SODIUM CHLORIDE 0.9 % IJ SOLN
10.0000 mL | Freq: Two times a day (BID) | INTRAMUSCULAR | Status: DC
  Administered 2015-06-04: 10 mL

## 2015-06-04 MED ORDER — KCL IN DEXTROSE-NACL 20-5-0.9 MEQ/L-%-% IV SOLN: INTRAVENOUS | Status: DC

## 2015-06-04 MED ORDER — PRO-STAT SUGAR FREE PO LIQD: 30.0000 mL | Freq: Two times a day (BID) | ORAL | Status: DC

## 2015-06-04 MED ORDER — SODIUM CHLORIDE 0.9 % IJ SOLN
10.0000 mL | INTRAMUSCULAR | Status: DC | PRN
Start: 2015-06-04 — End: 2015-06-05

## 2015-06-04 MED ORDER — RESOURCE THICKENUP CLEAR PO POWD
ORAL | Status: DC | PRN
  Filled 2015-06-04: qty 125

## 2015-06-04 MED ORDER — PANTOPRAZOLE SODIUM 40 MG PO TBEC: 40.0000 mg | DELAYED_RELEASE_TABLET | Freq: Every day | ORAL | Status: DC

## 2015-06-04 MED ORDER — MORPHINE SULFATE 2 MG/ML IJ SOLN
0.5000 mg | INTRAMUSCULAR | Status: DC | PRN
  Administered 2015-06-04 – 2015-06-05 (×2): 0.5 mg via INTRAVENOUS
  Filled 2015-06-04 (×2): qty 1

## 2015-06-04 NOTE — Progress Notes (Signed)
Subjective: He was able to be successfully extubated yesterday. He is generally improved. He doesn't answer questions but he does look my way  Objective: Vital signs in last 24 hours: Temp:  [97.7 F (36.5 C)-99.4 F (37.4 C)] 99.4 F (37.4 C) (08/03 0733) Pulse Rate:  [57-102] 88 (08/03 0733) Resp:  [19-40] 28 (08/03 0733) BP: (113-188)/(49-105) 120/56 mmHg (08/03 0700) SpO2:  [91 %-100 %] 100 % (08/03 0745) FiO2 (%):  [32 %] 32 % (08/03 0733) Weight:  [48.1 kg (106 lb 0.7 oz)] 48.1 kg (106 lb 0.7 oz) (08/03 0400) Weight change: -0.7 kg (-1 lb 8.7 oz) Last BM Date: 06/03/15  Intake/Output from previous day: 08/02 0701 - 08/03 0700 In: -  Out: 1600 [Urine:1600]  PHYSICAL EXAM General appearance: He is more alert. He is more responsive. He is still very thin. Resp: clear to auscultation bilaterally Cardio: regular rate and rhythm, S1, S2 normal, no murmur, click, rub or gallop GI: soft, non-tender; bowel sounds normal; no masses,  no organomegaly Extremities: extremities normal, atraumatic, no cyanosis or edema  Lab Results:  Results for orders placed or performed during the hospital encounter of 05/28/15 (from the past 48 hour(s))  Blood gas, arterial     Status: Abnormal   Collection Time: 06/02/15 10:09 AM  Result Value Ref Range   FIO2 40.00    Delivery systems VENTILATOR    Mode CONTINUOUS POSITIVE AIRWAY PRESSURE    Peep/cpap 5.0 cm H20   Pressure support 5 cm H20   pH, Arterial 7.344 (L) 7.350 - 7.450   pCO2 arterial 42.5 35.0 - 45.0 mmHg   pO2, Arterial 104 (H) 80.0 - 100.0 mmHg   Bicarbonate 22.6 20.0 - 24.0 mEq/L   TCO2 20.1 0 - 100 mmol/L   Acid-base deficit 2.6 (H) 0.0 - 2.0 mmol/L   O2 Saturation 97.9 %   Collection site RIGHT RADIAL    Drawn by [id number]    Sample type ARTHROGRAPHIS SPECIES    Allens test (pass/fail) PASS PASS  Glucose, capillary     Status: Abnormal   Collection Time: 06/02/15 11:19 AM  Result Value Ref Range   Glucose-Capillary 126  (H) 65 - 99 mg/dL   Comment 1 Notify RN    Comment 2 Document in Chart   Glucose, capillary     Status: Abnormal   Collection Time: 06/02/15  4:32 PM  Result Value Ref Range   Glucose-Capillary 125 (H) 65 - 99 mg/dL   Comment 1 Notify RN    Comment 2 Document in Chart   Glucose, capillary     Status: Abnormal   Collection Time: 06/03/15  3:46 AM  Result Value Ref Range   Glucose-Capillary 116 (H) 65 - 99 mg/dL  Basic metabolic panel     Status: Abnormal   Collection Time: 06/03/15  4:32 AM  Result Value Ref Range   Sodium 147 (H) 135 - 145 mmol/L   Potassium 3.5 3.5 - 5.1 mmol/L   Chloride 118 (H) 101 - 111 mmol/L   CO2 22 22 - 32 mmol/L   Glucose, Bld 119 (H) 65 - 99 mg/dL   BUN 35 (H) 6 - 20 mg/dL   Creatinine, Ser 1.71 (H) 0.61 - 1.24 mg/dL   Calcium 8.2 (L) 8.9 - 10.3 mg/dL   GFR calc non Af Amer 32 (L) >60 mL/min   GFR calc Af Amer 37 (L) >60 mL/min    Comment: (NOTE) The eGFR has been calculated using the CKD EPI equation. This  calculation has not been validated in all clinical situations. eGFR's persistently <60 mL/min signify possible Chronic Kidney Disease.    Anion gap 7 5 - 15  Glucose, capillary     Status: Abnormal   Collection Time: 06/03/15  7:36 AM  Result Value Ref Range   Glucose-Capillary 107 (H) 65 - 99 mg/dL   Comment 1 Notify RN    Comment 2 Document in Chart   Glucose, capillary     Status: None   Collection Time: 06/03/15 11:17 AM  Result Value Ref Range   Glucose-Capillary 93 65 - 99 mg/dL   Comment 1 Notify RN    Comment 2 Document in Chart   Glucose, capillary     Status: None   Collection Time: 06/03/15  4:13 PM  Result Value Ref Range   Glucose-Capillary 96 65 - 99 mg/dL   Comment 1 Notify RN    Comment 2 Document in Chart   Glucose, capillary     Status: None   Collection Time: 06/03/15  8:33 PM  Result Value Ref Range   Glucose-Capillary 89 65 - 99 mg/dL   Comment 1 Notify RN   Glucose, capillary     Status: None   Collection  Time: 06/04/15 12:22 AM  Result Value Ref Range   Glucose-Capillary 96 65 - 99 mg/dL   Comment 1 Notify RN   Basic metabolic panel     Status: Abnormal   Collection Time: 06/04/15  4:22 AM  Result Value Ref Range   Sodium 145 135 - 145 mmol/L   Potassium 3.4 (L) 3.5 - 5.1 mmol/L   Chloride 116 (H) 101 - 111 mmol/L   CO2 19 (L) 22 - 32 mmol/L   Glucose, Bld 103 (H) 65 - 99 mg/dL   BUN 35 (H) 6 - 20 mg/dL   Creatinine, Ser 1.70 (H) 0.61 - 1.24 mg/dL   Calcium 8.4 (L) 8.9 - 10.3 mg/dL   GFR calc non Af Amer 32 (L) >60 mL/min   GFR calc Af Amer 37 (L) >60 mL/min    Comment: (NOTE) The eGFR has been calculated using the CKD EPI equation. This calculation has not been validated in all clinical situations. eGFR's persistently <60 mL/min signify possible Chronic Kidney Disease.    Anion gap 10 5 - 15  Glucose, capillary     Status: Abnormal   Collection Time: 06/04/15  4:30 AM  Result Value Ref Range   Glucose-Capillary 108 (H) 65 - 99 mg/dL   Comment 1 Notify RN   Glucose, capillary     Status: None   Collection Time: 06/04/15  7:29 AM  Result Value Ref Range   Glucose-Capillary 90 65 - 99 mg/dL    ABGS  Recent Labs  06/02/15 1009  PHART 7.344*  PO2ART 104*  TCO2 20.1  HCO3 22.6   CULTURES Recent Results (from the past 240 hour(s))  Urine culture     Status: None   Collection Time: 05/28/15  4:40 PM  Result Value Ref Range Status   Specimen Description URINE, CATHETERIZED  Final   Special Requests NONE  Final   Culture   Final    20,000 COLONIES/mL STAPHYLOCOCCUS SPECIES (COAGULASE NEGATIVE) Performed at [facility]    Report Status 05/31/2015 FINAL  Final   Organism ID, Bacteria STAPHYLOCOCCUS SPECIES (COAGULASE NEGATIVE)  Final      Susceptibility   Staphylococcus species (coagulase negative) - MIC*    CIPROFLOXACIN <=0.5 SENSITIVE Sensitive     GENTAMICIN <=  0.5 SENSITIVE Sensitive     NITROFURANTOIN <=16 SENSITIVE Sensitive     OXACILLIN <=0.25  SENSITIVE Sensitive     TETRACYCLINE 2 SENSITIVE Sensitive     VANCOMYCIN <=0.5 SENSITIVE Sensitive     TRIMETH/SULFA <=10 SENSITIVE Sensitive     CLINDAMYCIN 0.5 SENSITIVE Sensitive     RIFAMPIN <=0.5 SENSITIVE Sensitive     Inducible Clindamycin NEGATIVE Sensitive     * 20,000 COLONIES/mL STAPHYLOCOCCUS SPECIES (COAGULASE NEGATIVE)  MRSA PCR Screening     Status: None   Collection Time: 05/28/15 11:52 PM  Result Value Ref Range Status   MRSA by PCR NEGATIVE NEGATIVE Final    Comment:        The GeneXpert MRSA Assay (FDA approved for NASAL specimens only), is one component of a comprehensive MRSA colonization surveillance program. It is not intended to diagnose MRSA infection nor to guide or monitor treatment for MRSA infections.   Culture, Urine     Status: None   Collection Time: 05/31/15 10:54 PM  Result Value Ref Range Status   Specimen Description URINE, CATHETERIZED  Final   Special Requests Normal  Final   Culture   Final    NO GROWTH 2 DAYS Performed at [facility]    Report Status 06/03/2015 FINAL  Final  Culture, respiratory (NON-Expectorated)     Status: None   Collection Time: 05/31/15 10:54 PM  Result Value Ref Range Status   Specimen Description TRACHEAL ASPIRATE  Final   Special Requests NONE  Final   Gram Stain   Final    ABUNDANT WBC PRESENT,BOTH PMN AND MONONUCLEAR RARE SQUAMOUS EPITHELIAL CELLS PRESENT NO ORGANISMS SEEN Performed at [facility]    Culture   Final    MODERATE CANDIDA ALBICANS Performed at [facility]    Report Status 06/04/2015 FINAL  Final  Culture, blood (routine x 2)     Status: None (Preliminary result)   Collection Time: 05/31/15 11:10 PM  Result Value Ref Range Status   Specimen Description BLOOD RIGHT ANTECUBITAL  Final   Special Requests BOTTLES DRAWN AEROBIC AND ANAEROBIC 6CC EACH  Final   Culture NO GROWTH 3 DAYS  Final   Report Status PENDING  Incomplete  Culture, blood (routine x 2)      Status: None (Preliminary result)   Collection Time: 05/31/15 11:17 PM  Result Value Ref Range Status   Specimen Description BLOOD LEFT ARM  Final   Special Requests BOTTLES DRAWN AEROBIC AND ANAEROBIC 6CC EACH  Final   Culture NO GROWTH 3 DAYS  Final   Report Status PENDING  Incomplete   Studies/Results: No results found.  Medications:  Prior to Admission:  Prescriptions prior to admission  Medication Sig Dispense Refill Last Dose  . polyethylene glycol (MIRALAX / GLYCOLAX) packet Take 17 g by mouth daily. 14 each 0 05/28/2015 at Unknown time   Scheduled: . albuterol  2.5 mg Nebulization Q6H  . antiseptic oral rinse  7 mL Mouth Rinse q12n4p  . antiseptic oral rinse  7 mL Mouth Rinse QID  . antiseptic oral rinse  7 mL Mouth Rinse q12n4p  . chlorhexidine  15 mL Mouth Rinse BID  . clindamycin (CLEOCIN) IV  600 mg Intravenous Q8H  . enoxaparin (LOVENOX) injection  30 mg Subcutaneous Q24H  . feeding supplement (PRO-STAT SUGAR FREE 64)  30 mL Per Tube Daily  . feeding supplement (VITAL AF 1.2 CAL)  1,000 mL Per Tube Q24H  . fentaNYL (SUBLIMAZE) injection  50  mcg Intravenous Once  . metoCLOPramide (REGLAN) injection  5 mg Intravenous 4 times per day  . metoprolol tartrate  50 mg Oral BID  . pantoprazole (PROTONIX) IV  40 mg Intravenous Q24H   Continuous: . dextrose 75 mL/hr at 06/04/15 0701  . fentaNYL infusion INTRAVENOUS 25 mcg/hr (06/02/15 2100)   EJY:LTEIHDTPNSQZY (TYLENOL) oral liquid 160 mg/5 mL, acetaminophen **OR** acetaminophen, fentaNYL, fentaNYL (SUBLIMAZE) injection, fentaNYL (SUBLIMAZE) injection, hydrALAZINE, midazolam, midazolam, ondansetron **OR** ondansetron (ZOFRAN) IV  Assesment: He was admitted with hypernatremia and acute renal failure. That is better. He has aspirated solid food and developed acute ventilator-dependent respiratory failure. He was able to come off the ventilator yesterday. After discussion with his son considering his advanced age protein calorie  malnutrition and dementia he has agreed to no CODE BLUE status. I think that's appropriate. I think he can transfer from the ICU today. Active Problems:   Leukocytosis   Hypertension   Hypernatremia   Protein-calorie malnutrition, severe   ARF (acute renal failure)   Aspiration into airway   FTT (failure to thrive) in adult   Palliative care encounter   DNR (do not resuscitate) discussion   Acute respiratory failure    Plan: Transfer from ICU    LOS: 7 days   [place] L 06/04/2015, 8:07 AM

## 2015-06-04 NOTE — Progress Notes (Signed)
Daily Progress Note   Patient Name: Jerry [name]       Date: 06/04/2015 DOB: Mar 16, 1916  Age: 81 y.o. MRN#: [id number] Attending Physician: Sinda Du, MD Primary Care Physician: Lanette Hampshire, MD Admit Date: 05/28/2015  Reason for Consultation/Follow-up: Establishing goals of care and Psychosocial/spiritual support  Subjective: Jerry [name] has been transferred from ICU to a regular bed.  He is unable to make eye contact and communicate.  I had reached out to his son and daughter in law for a family meeting today to talk about his health.  Jerry [name] sister, Jerry [name], is also visiting today.  She states that her brother was visiting/talking on July 4th.  She states he isn't communicating with her today, and notes that he has "had a long, fruitful life".  We talk about making sure he is comfortable, and his sister states this is important.   Nursing staff report that Nurse tech attempted to feed Jerry [name] and he made gurgling sounds; they didn't feel they could continue with feeding.  He has continued poor PO intake with s/s of aspiration today.   He was evaluated by PT and found to NOT be a candidate for rehab.   Jerry [name] talk about Jerry [name]'s comfort as their priority.  We talk about his issues swallowing and that he will continue to get aspiration pneumonia as a result.  We talk about his frailty and the likely hood that he will not be able to get better.  We talk about increased work of breathing, and the use of medications to ease the discomfort.  Jerry [name] state they feel that Hospice home in [place] is the best option for him now at end of life.     Interval Events: Jerry [name] was extubated yesterday, with goal to not re intubate.   Length of Stay: 7 days  Current Medications: Scheduled Meds:  . albuterol  2.5 mg Nebulization Q6H  . clindamycin (CLEOCIN) IV  600 mg Intravenous Q8H  . enoxaparin (LOVENOX) injection  30 mg  Subcutaneous Q24H  . feeding supplement (PRO-STAT SUGAR FREE 64)  30 mL Oral BID BM  . metoCLOPramide  5 mg Oral TID AC & HS  . metoprolol tartrate  50 mg Oral BID  . pantoprazole  40 mg Oral QHS  . polyethylene glycol  17 g Oral Daily  . sodium chloride  10-40 mL Intracatheter Q12H    Continuous Infusions: . dextrose 5 % with KCl 20 mEq / L 20 mEq (06/04/15 0917)    PRN Meds: acetaminophen **OR** acetaminophen, hydrALAZINE, ondansetron **OR** ondansetron (ZOFRAN) IV, RESOURCE THICKENUP CLEAR, sodium chloride  Palliative Performance Scale: 20%     Vital Signs: BP 129/102 mmHg  Pulse 97  Temp(Src) 99.4 F (37.4 C) (Oral)  Resp 32  Ht 5\' 2"  (1.575 m)  Wt 48.1 kg (106 lb 0.7 oz)  BMI 19.39 kg/m2  SpO2 100% SpO2: SpO2: 100 % O2 Device: O2 Device: Nasal Cannula O2 Flow Rate: O2 Flow Rate (L/min): 3 L/min  Intake/output summary:  Intake/Output Summary (Last 24 hours) at 06/04/15 1420 Last data filed at 06/04/15 0814  Gross per 24 hour  Intake    120 ml  Output   1100 ml  Net   -980 ml   LBM:   Baseline Weight: Weight: 43.4 kg (95 lb 10.9 oz) Most recent weight: Weight: 48.1 kg (106 lb 0.7 oz)  Physical Exam: Constitutional: Elderly frail, lying in bed, no  eye contact, but will briefly open eyes.  Head: Temporal wasting.  Cardio: RRR Resp: Even, mild/moderate work of breathing.  Musc/skel: marked muscle wasting, deconditioning.           Additional Data Reviewed: Recent Labs     06/02/15  0026   06/03/15  0432  06/04/15  0422  WBC  20.3*   --    --    --   HGB  12.3*   --    --    --   PLT  80*   --    --    --   NA  143   < >  147*  145  BUN  40*   < >  35*  35*  CREATININE  1.83*   < >  1.71*  1.70*   < > = values in this interval not displayed.     Problem List:  Patient Active Problem List   Diagnosis Date Noted  . Dementia 06/04/2015  . Acute respiratory failure 06/03/2015  . Aspiration into airway   . FTT (failure to thrive) in adult   .  Palliative care encounter   . DNR (do not resuscitate) discussion   . ARF (acute renal failure) 05/28/2015  . Protein-calorie malnutrition, severe 01/19/2015  . Altered mental status 01/17/2015  . Loss of appetite 01/13/2015  . Dehydration 01/13/2015  . CN (constipation) 01/13/2015  . Abdominal pain 01/13/2015  . Hypertension 10/21/2012  . Hypernatremia 10/21/2012  . Hip fracture, left 10/17/2012  . UTI (lower urinary tract infection) 10/17/2012  . Hypokalemia 10/17/2012  . Leukocytosis 10/17/2012     Palliative Care Assessment & Plan    Code Status:  DNR  Goals of Care:  Transition to inpatient Hospice.   Symptom Management:  Morphine 0.5 mg IV Q 4 hours PRN, dyspnea, pain   Prognosis: < 2 weeks Discharge Planning: Hospice facility   Care plan was discussed with nursing staff, CM, SW and Dr. Luan Pulling.   Thank you for allowing the Palliative Medicine Team to assist in the care of this patient.   Time In: 1300 Time Out: 1430 Total Time 90 minutes Prolonged Time Billed  no     Greater than 50%  of this time was spent counseling and coordinating care related to the above assessment and plan.   Drue Novel, NP  06/04/2015, 2:20 PM  Please contact Palliative Medicine Team phone at [phone] for questions and concerns.

## 2015-06-04 NOTE — Clinical Social Work Note (Signed)
Palliative care was able to reach family today. They have decided to take comfort care approach and are interested in [place]. Daughter-in-law indicates that palliative care addressed their questions and they are agreeable to referral to [facility] in [place]. MD also aware and agreeable. CSW faxed referral and will follow up in AM.  Benay Pike, [place]

## 2015-06-04 NOTE — Evaluation (Signed)
Physical Therapy Evaluation Patient Details Name: Jerry [name] MRN: [id number] DOB: 1916-08-10 Today's Date: 06/04/2015   History of Present Illness  Pt is a 81 year old male with multiple medica problems admitted with hyponatremia.  He lived in a [place] and required max assist with ADLs.  During his hospital stay he suffered severe respiratory distress and needed to be intubated.  He is now out of ICU.  Palliative Care has already been consulted and his Palliative Score=10.  Clinical Impression   Pt was seen for evaluation.  He was sleeping at my arrival and would awaken momentarily.  He was unable to follow any directions and did not exhibit any spontaneous motion during my visit.  Based on his Palliative Score, pt is not appropriate for PT.  SNF is therefore not an appropriate discharge plan.    Follow Up Recommendations No PT follow up    Equipment Recommendations  None recommended by PT    Recommendations for Other Services   none    Precautions / Restrictions Precautions Precautions: Fall Restrictions Weight Bearing Restrictions: No      Mobility  Bed Mobility Overal bed mobility: +2 for physical assistance             General bed mobility comments: total assist  Transfers                 General transfer comment: will need total assist for any transfer  Ambulation/Gait             General Gait Details: not ambulatory  Stairs            Wheelchair Mobility    Modified Rankin (Stroke Patients Only)       Balance Overall balance assessment:  (N/A)                                           Pertinent Vitals/Pain Pain Assessment: Faces Faces Pain Scale: No hurt    Home Living Family/patient expects to be discharged to:: Unsure                 Additional Comments: pt will require a higher level of care than ACLF    Prior Function           Comments: unknown     Hand Dominance         Extremity/Trunk Assessment   Upper Extremity Assessment: Generalized weakness           Lower Extremity Assessment: Generalized weakness         Communication   Communication: HOH  Cognition Arousal/Alertness: Lethargic Behavior During Therapy: WFL for tasks assessed/performed Overall Cognitive Status: History of cognitive impairments - at baseline                      General Comments      Exercises        Assessment/Plan    PT Assessment Patent does not need any further PT services  PT Diagnosis     PT Problem List    PT Treatment Interventions     PT Goals (Current goals can be found in the Care Plan section) Acute Rehab PT Goals PT Goal Formulation: All assessment and education complete, DC therapy    Frequency     Barriers to discharge        Co-evaluation  End of Session Equipment Utilized During Treatment: Oxygen Activity Tolerance: Patient limited by fatigue Patient left: in bed;with call bell/phone within reach;with bed alarm set Nurse Communication: Mobility status;Need for lift equipment         Time: 1111-1130 PT Time Calculation (min) (ACUTE ONLY): 19 min   Charges:   PT Evaluation $Initial PT Evaluation Tier I: 1 Procedure     PT G CodesSable Feil  PT 06/04/2015, 11:42 AM [phone]

## 2015-06-04 NOTE — Progress Notes (Signed)
Jerry [name]  MRN: [id number]  DOB/AGE: [birth year] 81 y.o.  Primary Care Physician:MCINNIS,ANGUS G, MD  Admit date: 05/28/2015  Chief Complaint:  Chief Complaint  Patient presents with  . Fatigue    S-Pt presented on  05/28/2015 with  Chief Complaint  Patient presents with  . Fatigue  .    Pt offers no specific complaints   Meds  . albuterol  2.5 mg Nebulization Q6H  . antiseptic oral rinse  7 mL Mouth Rinse q12n4p  . antiseptic oral rinse  7 mL Mouth Rinse QID  . antiseptic oral rinse  7 mL Mouth Rinse q12n4p  . chlorhexidine  15 mL Mouth Rinse BID  . clindamycin (CLEOCIN) IV  600 mg Intravenous Q8H  . enoxaparin (LOVENOX) injection  30 mg Subcutaneous Q24H  . feeding supplement (PRO-STAT SUGAR FREE 64)  30 mL Per Tube Daily  . feeding supplement (VITAL AF 1.2 CAL)  1,000 mL Per Tube Q24H  . fentaNYL (SUBLIMAZE) injection  50 mcg Intravenous Once  . metoCLOPramide (REGLAN) injection  5 mg Intravenous 4 times per day  . metoprolol tartrate  50 mg Oral BID  . pantoprazole (PROTONIX) IV  40 mg Intravenous Q24H       Physical Exam: Vital signs in last 24 hours: Temp:  [97.7 F (36.5 C)-99.4 F (37.4 C)] 99.4 F (37.4 C) (08/03 0733) Pulse Rate:  [57-102] 97 (08/03 0800) Resp:  [19-40] 32 (08/03 0800) BP: (113-188)/(49-105) 129/102 mmHg (08/03 0800) SpO2:  [91 %-100 %] 100 % (08/03 0800) FiO2 (%):  [32 %] 32 % (08/03 0733) Weight:  [106 lb 0.7 oz (48.1 kg)] 106 lb 0.7 oz (48.1 kg) (08/03 0400) Weight change: -1 lb 8.7 oz (-0.7 kg) Last BM Date: 06/03/15  Intake/Output from previous day: 08/02 0701 - 08/03 0700 In: -  Out: 1600 [Urine:1600]     Physical Exam: General- pt is awake,but not  oriented to time place and person Resp- No acute REsp distress,Rhonchi present CVS- S1S2 regular in rate and rhythm GIT- BS+, soft, NT, ND EXT- NO LE Edema, Cyanosis   Lab Results: CBC  Recent Labs  06/02/15 0026  WBC 20.3*  HGB 12.3*  HCT 37.1*  PLT 80*     BMET  Recent Labs  06/03/15 0432 06/04/15 0422  NA 147* 145  K 3.5 3.4*  CL 118* 116*  CO2 22 19*  GLUCOSE 119* 103*  BUN 35* 35*  CREATININE 1.71* 1.70*  CALCIUM 8.2* 8.4*   Trend Creat 2016 2.2=>1.7   Sodium 2016 178=>147=>145   MICRO Recent Results (from the past 240 hour(s))  Urine culture     Status: None   Collection Time: 05/28/15  4:40 PM  Result Value Ref Range Status   Specimen Description URINE, CATHETERIZED  Final   Special Requests NONE  Final   Culture   Final    20,000 COLONIES/mL STAPHYLOCOCCUS SPECIES (COAGULASE NEGATIVE) Performed at [facility]    Report Status 05/31/2015 FINAL  Final   Organism ID, Bacteria STAPHYLOCOCCUS SPECIES (COAGULASE NEGATIVE)  Final      Susceptibility   Staphylococcus species (coagulase negative) - MIC*    CIPROFLOXACIN <=0.5 SENSITIVE Sensitive     GENTAMICIN <=0.5 SENSITIVE Sensitive     NITROFURANTOIN <=16 SENSITIVE Sensitive     OXACILLIN <=0.25 SENSITIVE Sensitive     TETRACYCLINE 2 SENSITIVE Sensitive     VANCOMYCIN <=0.5 SENSITIVE Sensitive     TRIMETH/SULFA <=10 SENSITIVE Sensitive     CLINDAMYCIN 0.5 SENSITIVE  Sensitive     RIFAMPIN <=0.5 SENSITIVE Sensitive     Inducible Clindamycin NEGATIVE Sensitive     * 20,000 COLONIES/mL STAPHYLOCOCCUS SPECIES (COAGULASE NEGATIVE)  MRSA PCR Screening     Status: None   Collection Time: 05/28/15 11:52 PM  Result Value Ref Range Status   MRSA by PCR NEGATIVE NEGATIVE Final    Comment:        The GeneXpert MRSA Assay (FDA approved for NASAL specimens only), is one component of a comprehensive MRSA colonization surveillance program. It is not intended to diagnose MRSA infection nor to guide or monitor treatment for MRSA infections.   Culture, Urine     Status: None   Collection Time: 05/31/15 10:54 PM  Result Value Ref Range Status   Specimen Description URINE, CATHETERIZED  Final   Special Requests Normal  Final   Culture   Final    NO GROWTH  2 DAYS Performed at [facility]    Report Status 06/03/2015 FINAL  Final  Culture, respiratory (NON-Expectorated)     Status: None   Collection Time: 05/31/15 10:54 PM  Result Value Ref Range Status   Specimen Description TRACHEAL ASPIRATE  Final   Special Requests NONE  Final   Gram Stain   Final    ABUNDANT WBC PRESENT,BOTH PMN AND MONONUCLEAR RARE SQUAMOUS EPITHELIAL CELLS PRESENT NO ORGANISMS SEEN Performed at [facility]    Culture   Final    MODERATE CANDIDA ALBICANS Performed at [facility]    Report Status 06/04/2015 FINAL  Final  Culture, blood (routine x 2)     Status: None (Preliminary result)   Collection Time: 05/31/15 11:10 PM  Result Value Ref Range Status   Specimen Description BLOOD RIGHT ANTECUBITAL  Final   Special Requests BOTTLES DRAWN AEROBIC AND ANAEROBIC 6CC EACH  Final   Culture NO GROWTH 3 DAYS  Final   Report Status PENDING  Incomplete  Culture, blood (routine x 2)     Status: None (Preliminary result)   Collection Time: 05/31/15 11:17 PM  Result Value Ref Range Status   Specimen Description BLOOD LEFT ARM  Final   Special Requests BOTTLES DRAWN AEROBIC AND ANAEROBIC 6CC EACH  Final   Culture NO GROWTH 3 DAYS  Final   Report Status PENDING  Incomplete      Lab Results  Component Value Date   CALCIUM 8.4* 06/04/2015   CAION 1.33* 01/13/2015               Impression: 1)Renal  AKI secondary to Prerenal                AKI now better                AKI on CKD               CKD stage 2.               CKD secondary to age associated decline                Progression of CKD now marked with AKI                  2)HTN  BP on higher side  Medication- On Beta blockers    3)Anemia HGb stable   4)Resp- Pt was admitted with aspiration and resp distress     Pt was earlier intubated  5)Hypernatremia  improving  6)CNS- hx of dementia  7)Acid base Co2  not  at goal   8) Hypokalemia    Will  replete    Plan:  Will  replete kcl Will add biarb if bicarb low in am     BHUTANI,MANPREET S 06/04/2015, 8:37 AM

## 2015-06-04 NOTE — Progress Notes (Signed)
The patient is receiving Protonix by the intravenous route.  Based on criteria approved by the Pharmacy and [place], the medication is being converted to the equivalent oral dose form.  These criteria include: -No Active GI bleeding -Able to tolerate diet of full liquids (or better) or tube feeding OR able to tolerate other medications by the oral or enteral route  If you have any questions about this conversion, please contact the Pharmacy Department (ext [phone]).  Thank you.  Ena Dawley, [facility] 06/04/2015 12:49 PM

## 2015-06-04 NOTE — Clinical Social Work Note (Signed)
CSW discussed pt with palliative care and MD. No family present at this time. Palliative care to meet with family hopefully later today to discuss goals of care further. CSW will follow up in AM with family to provide support and talk about d/c planning.  Benay Pike, [place]

## 2015-06-05 DIAGNOSIS — R6251 Failure to thrive (child): Secondary | ICD-10-CM | POA: Diagnosis present

## 2015-06-05 LAB — GLUCOSE, CAPILLARY
GLUCOSE-CAPILLARY: 69 mg/dL (ref 65–99)
GLUCOSE-CAPILLARY: 77 mg/dL (ref 65–99)
GLUCOSE-CAPILLARY: 79 mg/dL (ref 65–99)

## 2015-06-05 LAB — CULTURE, BLOOD (ROUTINE X 2)
CULTURE: NO GROWTH
Culture: NO GROWTH

## 2015-06-05 MED ORDER — MORPHINE SULFATE (CONCENTRATE) 10 MG/0.5ML PO SOLN
2.5000 mg | ORAL | Status: DC | PRN
  Administered 2015-06-05: 2.6 mg via ORAL
  Filled 2015-06-05: qty 0.5

## 2015-06-05 MED ORDER — MORPHINE SULFATE (CONCENTRATE) 10 MG/0.5ML PO SOLN
2.5000 mg | ORAL | Status: DC
  Administered 2015-06-05 – 2015-06-07 (×13): 2.6 mg via ORAL
  Filled 2015-06-05 (×13): qty 0.5

## 2015-06-05 MED ORDER — ACETAMINOPHEN 650 MG RE SUPP: 650.0000 mg | Freq: Four times a day (QID) | RECTAL | Status: DC | PRN

## 2015-06-05 MED ORDER — MORPHINE SULFATE 2 MG/ML IJ SOLN: 0.5000 mg | INTRAMUSCULAR | Status: AC | PRN

## 2015-06-05 MED ORDER — MORPHINE SULFATE (CONCENTRATE) 10 MG/0.5ML PO SOLN: 2.5000 mg | ORAL | Status: DC | PRN

## 2015-06-05 MED ORDER — SCOPOLAMINE 1 MG/3DAYS TD PT72
1.0000 | MEDICATED_PATCH | TRANSDERMAL | Status: DC
  Administered 2015-06-05: 1.5 mg via TRANSDERMAL
  Filled 2015-06-05: qty 1

## 2015-06-05 MED ORDER — ALBUTEROL SULFATE (2.5 MG/3ML) 0.083% IN NEBU: 2.5000 mg | INHALATION_SOLUTION | Freq: Four times a day (QID) | RESPIRATORY_TRACT | Status: AC | PRN

## 2015-06-05 MED ORDER — RESOURCE THICKENUP CLEAR PO POWD
ORAL | Status: DC | PRN
  Filled 2015-06-05: qty 125

## 2015-06-05 MED ORDER — ACETAMINOPHEN 650 MG RE SUPP: 650.0000 mg | Freq: Four times a day (QID) | RECTAL | Status: AC | PRN

## 2015-06-05 MED ORDER — ONDANSETRON HCL 4 MG/2ML IJ SOLN: 4.0000 mg | Freq: Four times a day (QID) | INTRAMUSCULAR | Status: AC | PRN

## 2015-06-05 MED ORDER — SCOPOLAMINE 1 MG/3DAYS TD PT72
1.0000 | MEDICATED_PATCH | TRANSDERMAL | Status: DC
  Filled 2015-06-05: qty 1

## 2015-06-05 MED ORDER — ALBUTEROL SULFATE (2.5 MG/3ML) 0.083% IN NEBU
2.5000 mg | INHALATION_SOLUTION | Freq: Four times a day (QID) | RESPIRATORY_TRACT | Status: DC | PRN
Start: 2015-06-05 — End: 2015-06-07
  Administered 2015-06-05: 2.5 mg via RESPIRATORY_TRACT
  Filled 2015-06-05: qty 3

## 2015-06-05 NOTE — Clinical Social Work Note (Signed)
Pt made GIP this morning as no beds at [facility]. CSW discussed with pt's son, Iona Beard after hospice consult. He is aware that pt will transfer to [place] if stable once bed is available. Support provided.  Benay Pike, [place]

## 2015-06-05 NOTE — H&P (Signed)
Jerry [name], [name] NO.:  [id number]  MEDICAL RECORD NO.:  [id number]  LOCATION:  3A                            FACILITY:  [facility]  PHYSICIAN:  Jerry [name], M.D.DATE OF BIRTH:  17-Oct-1916  DATE OF ADMISSION:  06/05/2015 DATE OF DISCHARGE:  [facility]                             HISTORY & PHYSICAL   HISTORY OF PRESENT ILLNESS:  This is a patient who had been in the acute care hospital with severe problems with aspiration, requiring him to be intubated and placed on mechanical ventilation.  He was eventually able to be extubated; and because of his multiple severe medical problems including failure to thrive, malnutrition, difficulty swallowing, it was felt that he was appropriate for palliative care, and then, eventually it was felt that he was appropriate for hospice care.  He would be appropriate for hospice inpatient facility.  There are no beds available at this time, and he is going to be transferred from regular inpatient care to GIP.  PAST MEDICAL HISTORY:  Positive for dementia, failure to thrive, aspiration pneumonia, prostate cancer.  PAST SURGICAL HISTORY:  Surgically, he had hip surgery.  He had been on MiraLAX.  He has about a 40 pack year smoking history but is not smoking now.  He has family history of hypertension and prostate cancer.  He had been living at an assisted living facility.  REVIEW OF SYSTEMS:  Not obtainable.  PHYSICAL EXAMINATION:  GENERAL:  Shows a well-developed male who is in no acute distress but does have significant airway congestion. HEART:  Regular.  Pupils are reactive.  Nose and throat are clear. Mucous membranes are slightly dry. NECK:  Supple.  He will turn to me but does not communicate. ABDOMEN:  Soft without masses. EXTREMITIES:  No edema. CENTRAL NERVOUS SYSTEM:  He is poorly responsive.  PLAN:  Plan is for comfort care, end of life care.     Jerry [name], M.D.     ELH/MEDQ  D:  06/05/2015  T:   06/05/2015  Job:  [id number]

## 2015-06-05 NOTE — Progress Notes (Signed)
Subjective: I had further conversation with palliative care and with the patient's family. All are in agreement that he is hospice appropriate. He had significant issues with trying to swallow yesterday.  Objective: Vital signs in last 24 hours: Temp:  [99.5 F (37.5 C)-99.9 F (37.7 C)] 99.5 F (37.5 C) (08/04 0639) Pulse Rate:  [90-92] 92 (08/04 0639) Resp:  [20] 20 (08/04 0639) BP: (94-148)/(68-72) 148/68 mmHg (08/04 0639) SpO2:  [100 %] 100 % (08/04 0639) Weight:  [47.8 kg (105 lb 6.1 oz)] 47.8 kg (105 lb 6.1 oz) (08/04 0639) Weight change: -0.3 kg (-10.6 oz) Last BM Date: 06/03/15  Intake/Output from previous day: 08/03 0701 - 08/04 0700 In: 120 [P.O.:120] Out: 1452 [Urine:1450; Stool:2]  PHYSICAL EXAM General appearance: He opens his eyes and looks at me but makes no other response. Resp: rhonchi bilaterally and He is more congested than yesterday Cardio: regular rate and rhythm, S1, S2 normal, no murmur, click, rub or gallop GI: soft, non-tender; bowel sounds normal; no masses,  no organomegaly Extremities: extremities normal, atraumatic, no cyanosis or edema  Lab Results:  Results for orders placed or performed during the hospital encounter of 05/28/15 (from the past 48 hour(s))  Glucose, capillary     Status: None   Collection Time: 06/03/15 11:17 AM  Result Value Ref Range   Glucose-Capillary 93 65 - 99 mg/dL   Comment 1 Notify RN    Comment 2 Document in Chart   Glucose, capillary     Status: None   Collection Time: 06/03/15  4:13 PM  Result Value Ref Range   Glucose-Capillary 96 65 - 99 mg/dL   Comment 1 Notify RN    Comment 2 Document in Chart   Glucose, capillary     Status: None   Collection Time: 06/03/15  8:33 PM  Result Value Ref Range   Glucose-Capillary 89 65 - 99 mg/dL   Comment 1 Notify RN   Glucose, capillary     Status: None   Collection Time: 06/04/15 12:22 AM  Result Value Ref Range   Glucose-Capillary 96 65 - 99 mg/dL   Comment 1 Notify  RN   Basic metabolic panel     Status: Abnormal   Collection Time: 06/04/15  4:22 AM  Result Value Ref Range   Sodium 145 135 - 145 mmol/L   Potassium 3.4 (L) 3.5 - 5.1 mmol/L   Chloride 116 (H) 101 - 111 mmol/L   CO2 19 (L) 22 - 32 mmol/L   Glucose, Bld 103 (H) 65 - 99 mg/dL   BUN 35 (H) 6 - 20 mg/dL   Creatinine, Ser 1.70 (H) 0.61 - 1.24 mg/dL   Calcium 8.4 (L) 8.9 - 10.3 mg/dL   GFR calc non Af Amer 32 (L) >60 mL/min   GFR calc Af Amer 37 (L) >60 mL/min    Comment: (NOTE) The eGFR has been calculated using the CKD EPI equation. This calculation has not been validated in all clinical situations. eGFR's persistently <60 mL/min signify possible Chronic Kidney Disease.    Anion gap 10 5 - 15  Glucose, capillary     Status: Abnormal   Collection Time: 06/04/15  4:30 AM  Result Value Ref Range   Glucose-Capillary 108 (H) 65 - 99 mg/dL   Comment 1 Notify RN   Glucose, capillary     Status: None   Collection Time: 06/04/15  7:29 AM  Result Value Ref Range   Glucose-Capillary 90 65 - 99 mg/dL  Glucose, capillary  Status: None   Collection Time: 06/04/15 11:23 AM  Result Value Ref Range   Glucose-Capillary 92 65 - 99 mg/dL   Comment 1 Notify RN   Glucose, capillary     Status: Abnormal   Collection Time: 06/04/15  4:19 PM  Result Value Ref Range   Glucose-Capillary 109 (H) 65 - 99 mg/dL   Comment 1 Notify RN   Glucose, capillary     Status: None   Collection Time: 06/04/15  8:55 PM  Result Value Ref Range   Glucose-Capillary 89 65 - 99 mg/dL   Comment 1 Notify RN    Comment 2 Document in Chart   Glucose, capillary     Status: None   Collection Time: 06/05/15 12:41 AM  Result Value Ref Range   Glucose-Capillary 79 65 - 99 mg/dL   Comment 1 Notify RN    Comment 2 Document in Chart   Glucose, capillary     Status: None   Collection Time: 06/05/15  5:03 AM  Result Value Ref Range   Glucose-Capillary 77 65 - 99 mg/dL  Glucose, capillary     Status: None   Collection  Time: 06/05/15  7:33 AM  Result Value Ref Range   Glucose-Capillary 69 65 - 99 mg/dL   Comment 1 Notify RN     ABGS  Recent Labs  06/02/15 1009  PHART 7.344*  PO2ART 104*  TCO2 20.1  HCO3 22.6   CULTURES Recent Results (from the past 240 hour(s))  Urine culture     Status: None   Collection Time: 05/28/15  4:40 PM  Result Value Ref Range Status   Specimen Description URINE, CATHETERIZED  Final   Special Requests NONE  Final   Culture   Final    20,000 COLONIES/mL STAPHYLOCOCCUS SPECIES (COAGULASE NEGATIVE) Performed at [facility]    Report Status 05/31/2015 FINAL  Final   Organism ID, Bacteria STAPHYLOCOCCUS SPECIES (COAGULASE NEGATIVE)  Final      Susceptibility   Staphylococcus species (coagulase negative) - MIC*    CIPROFLOXACIN <=0.5 SENSITIVE Sensitive     GENTAMICIN <=0.5 SENSITIVE Sensitive     NITROFURANTOIN <=16 SENSITIVE Sensitive     OXACILLIN <=0.25 SENSITIVE Sensitive     TETRACYCLINE 2 SENSITIVE Sensitive     VANCOMYCIN <=0.5 SENSITIVE Sensitive     TRIMETH/SULFA <=10 SENSITIVE Sensitive     CLINDAMYCIN 0.5 SENSITIVE Sensitive     RIFAMPIN <=0.5 SENSITIVE Sensitive     Inducible Clindamycin NEGATIVE Sensitive     * 20,000 COLONIES/mL STAPHYLOCOCCUS SPECIES (COAGULASE NEGATIVE)  MRSA PCR Screening     Status: None   Collection Time: 05/28/15 11:52 PM  Result Value Ref Range Status   MRSA by PCR NEGATIVE NEGATIVE Final    Comment:        The GeneXpert MRSA Assay (FDA approved for NASAL specimens only), is one component of a comprehensive MRSA colonization surveillance program. It is not intended to diagnose MRSA infection nor to guide or monitor treatment for MRSA infections.   Culture, Urine     Status: None   Collection Time: 05/31/15 10:54 PM  Result Value Ref Range Status   Specimen Description URINE, CATHETERIZED  Final   Special Requests Normal  Final   Culture   Final    NO GROWTH 2 DAYS Performed at [facility]     Report Status 06/03/2015 FINAL  Final  Culture, respiratory (NON-Expectorated)     Status: None   Collection Time: 05/31/15 10:54 PM  Result Value Ref Range Status   Specimen Description TRACHEAL ASPIRATE  Final   Special Requests NONE  Final   Gram Stain   Final    ABUNDANT WBC PRESENT,BOTH PMN AND MONONUCLEAR RARE SQUAMOUS EPITHELIAL CELLS PRESENT NO ORGANISMS SEEN Performed at [facility]    Culture   Final    MODERATE CANDIDA ALBICANS Performed at [facility]    Report Status 06/04/2015 FINAL  Final  Culture, blood (routine x 2)     Status: None (Preliminary result)   Collection Time: 05/31/15 11:10 PM  Result Value Ref Range Status   Specimen Description BLOOD RIGHT ANTECUBITAL  Final   Special Requests BOTTLES DRAWN AEROBIC AND ANAEROBIC 6CC EACH  Final   Culture NO GROWTH 4 DAYS  Final   Report Status PENDING  Incomplete  Culture, blood (routine x 2)     Status: None (Preliminary result)   Collection Time: 05/31/15 11:17 PM  Result Value Ref Range Status   Specimen Description BLOOD LEFT ARM  Final   Special Requests BOTTLES DRAWN AEROBIC AND ANAEROBIC [place]  Final   Culture NO GROWTH 4 DAYS  Final   Report Status PENDING  Incomplete   Studies/Results: No results found.  Medications:  Prior to Admission:  Prescriptions prior to admission  Medication Sig Dispense Refill Last Dose  . polyethylene glycol (MIRALAX / GLYCOLAX) packet Take 17 g by mouth daily. 14 each 0 05/28/2015 at Unknown time   Scheduled: . sodium chloride  10-40 mL Intracatheter Q12H   Continuous:  PRN:[DISCONTINUED] acetaminophen **OR** acetaminophen, albuterol, morphine injection, [DISCONTINUED] ondansetron **OR** ondansetron (ZOFRAN) IV, RESOURCE THICKENUP CLEAR, sodium chloride  Assesment: He has multiple medical problems and is at the end of life. His IV came out and I'm going to leave it out. He is appropriate for hospice. He should be in the inpatient hospice if a bed is  available. Active Problems:   Leukocytosis   Hypertension   Hypernatremia   Protein-calorie malnutrition, severe   ARF (acute renal failure)   Aspiration into airway   FTT (failure to thrive) in adult   Palliative care encounter   DNR (do not resuscitate) discussion   Acute respiratory failure   Dementia    Plan: Await information from hospice    LOS: 8 days   Osie Amparo L 06/05/2015, 8:34 AM

## 2015-06-05 NOTE — Care Management Important Message (Signed)
Important Message  Patient Details  Name: Jerry [name] MRN: [id number] Date of Birth: Aug 07, 1916   Medicare Important Message Given:  Yes-third notification given    Sherald Barge, RN 06/05/2015, 10:45 AM

## 2015-06-05 NOTE — Progress Notes (Signed)
Dr. Luan Pulling phoned and stated to leave the i.v. Out and not attempt any additional sticks.

## 2015-06-05 NOTE — Care Management Note (Signed)
Case Management Note  Patient Details  Name: Jerry [name] MRN: [id number] Date of Birth: 05-Nov-1915  Expected Discharge Date:    06/05/2015              Expected Discharge Plan:  [place]  In-House Referral:  Clinical Social Work  Discharge planning Services  CM Consult  Post Acute Care Choice:  Hospice Choice offered to:  NA  DME Arranged:    DME Agency:     HH Arranged:    [place] Agency:     Status of Service:  Completed, signed off  Medicare Important Message Given:  Yes-second notification given Date Medicare IM Given:    Medicare IM give by:    Date Additional Medicare IM Given:    Additional Medicare Important Message give by:     If discussed at [place] of Stay Meetings, dates discussed:  06/05/2015  Additional Comments: Pt being make GIP today through [facility]. Per pt's family choice. CSW has made referral. No further CM needs.  Sherald Barge, RN 06/05/2015, 10:40 AM

## 2015-06-05 NOTE — Progress Notes (Signed)
Notified MD pt's EJ catheter was removed. Awaiting MD's response.

## 2015-06-05 NOTE — Discharge Summary (Addendum)
Physician Discharge Summary  Patient ID: Jerry [name] MRN: [id number] DOB/AGE: 1916/06/19 81 y.o. Primary Care Physician:MCINNIS,ANGUS G, MD Admit date: 05/28/2015 Discharge date: 06/05/2015    Discharge Diagnoses:   Active Problems:   Leukocytosis   Hypertension   Hypernatremia   Protein-calorie malnutrition, severe   ARF (acute renal failure)   Aspiration into airway   FTT (failure to thrive) in adult   Palliative care encounter   DNR (do not resuscitate) discussion   Acute respiratory failure   Dementia     Medication List    TAKE these medications        acetaminophen 650 MG suppository  Commonly known as:  TYLENOL  Place 1 suppository (650 mg total) rectally every 6 (six) hours as needed for mild pain (or Fever >/= 101).     albuterol (2.5 MG/3ML) 0.083% nebulizer solution  Commonly known as:  PROVENTIL  Take 3 mLs (2.5 mg total) by nebulization every 6 (six) hours as needed for wheezing or shortness of breath (for comfort and work of breathing.).     morphine 2 MG/ML injection  Inject 0.25 mLs (0.5 mg total) into the vein every 4 (four) hours as needed (please give for s/s of dyspnea and for RR 20 or greater.).     ondansetron 4 MG/2ML Soln injection  Commonly known as:  ZOFRAN  Inject 2 mLs (4 mg total) into the vein every 6 (six) hours as needed for nausea.      ASK your doctor about these medications        polyethylene glycol packet  Commonly known as:  MIRALAX / GLYCOLAX  Take 17 g by mouth daily.        Discharged Condition: Unchanged/terminal    Consults: Pulmonary/nephrology/palliative care  Significant Diagnostic Studies: Dg Abd 1 View  05/29/2015   CLINICAL DATA:  Generalized abdominal pain.  EXAM: ABDOMEN - 1 VIEW  COMPARISON:  01/17/2015  FINDINGS: Gallstones are visible in the right upper quadrant. There are calcifications superimposed on the left kidney which may represent nephrolithiasis. No ureteral calculi are evident. Abdominal  gas pattern is normal. Severe lumbar degenerative disc changes incidentally noted.  IMPRESSION: Cholelithiasis. Possible left nephrolithiasis. Normal bowel gas pattern.   Electronically Signed   By: Andreas Newport M.D.   On: 05/29/2015 19:12   Portable Chest Xray  05/31/2015   CLINICAL DATA:  Shortness of breath, aspiration  EXAM: PORTABLE CHEST - 1 VIEW  COMPARISON:  05/30/2015  FINDINGS: Endotracheal tube terminates 4 cm above the carina.  Mild bibasilar opacities, likely atelectasis. No focal consolidation. Mild blunting of the left costophrenic angle, possibly reflecting a small pleural effusion. No pneumothorax.  The heart is normal in size.  Enteric tube courses into the stomach.  IMPRESSION: Endotracheal tube terminates 4 cm above the carina.  Mild bibasilar opacities, likely atelectasis.   Electronically Signed   By: Julian Hy M.D.   On: 05/31/2015 08:22   Dg Chest Port 1 View  05/30/2015   CLINICAL DATA:  Aspiration.  EXAM: PORTABLE CHEST - 1 VIEW  COMPARISON:  May 28, 2015.  FINDINGS: The heart size and mediastinal contours are within normal limits. No pneumothorax or pleural effusion is noted. Minimal bibasilar subsegmental atelectasis is noted. Mild dextroscoliosis of thoracic spine.  IMPRESSION: Minimal bibasilar subsegmental atelectasis.   Electronically Signed   By: Marijo Conception, M.D.   On: 05/30/2015 12:20   Dg Chest Port 1 View  05/28/2015   CLINICAL DATA:  Week, not  eating  EXAM: PORTABLE CHEST - 1 VIEW  COMPARISON:  Chest x-ray of 01/17/2015  FINDINGS: The lungs are clear and somewhat hyperaerated. No infiltrate or effusion is seen. Mediastinal and hilar contours are unremarkable. The heart is within normal limits in size. No bony abnormality is seen.  IMPRESSION: Hyper aeration.  No active lung disease.   Electronically Signed   By: Ivar Drape M.D.   On: 05/28/2015 16:50   Dg Chest Port 1v Same Day  05/30/2015   CLINICAL DATA:  ET tube placement  EXAM: PORTABLE CHEST -  1 VIEW SAME DAY  COMPARISON:  05/30/2015, 05/28/2015  FINDINGS: The lungs are hyperinflated likely secondary to COPD. There is an endotracheal tube with the tip 4 cm above the carina. There is a nasogastric tube with the tip projecting over the stomach.  There is hazy right basilar airspace disease which may reflect atelectasis versus developing pneumonia. There is no pleural effusion or pneumothorax. The heart and mediastinal contours are unremarkable.  The osseous structures are unremarkable.  IMPRESSION: 1. Endotracheal tube with the tip 4 cm above the carina.   Electronically Signed   By: Kathreen Devoid   On: 05/30/2015 13:11    Lab Results: Basic Metabolic Panel:  Recent Labs  06/03/15 0432 06/04/15 0422  NA 147* 145  K 3.5 3.4*  CL 118* 116*  CO2 22 19*  GLUCOSE 119* 103*  BUN 35* 35*  CREATININE 1.71* 1.70*  CALCIUM 8.2* 8.4*   Liver Function Tests: No results for input(s): AST, ALT, ALKPHOS, BILITOT, PROT, ALBUMIN in the last 72 hours.   CBC: No results for input(s): WBC, NEUTROABS, HGB, HCT, MCV, PLT in the last 72 hours.  Recent Results (from the past 240 hour(s))  Urine culture     Status: None   Collection Time: 05/28/15  4:40 PM  Result Value Ref Range Status   Specimen Description URINE, CATHETERIZED  Final   Special Requests NONE  Final   Culture   Final    20,000 COLONIES/mL STAPHYLOCOCCUS SPECIES (COAGULASE NEGATIVE) Performed at [facility]    Report Status 05/31/2015 FINAL  Final   Organism ID, Bacteria STAPHYLOCOCCUS SPECIES (COAGULASE NEGATIVE)  Final      Susceptibility   Staphylococcus species (coagulase negative) - MIC*    CIPROFLOXACIN <=0.5 SENSITIVE Sensitive     GENTAMICIN <=0.5 SENSITIVE Sensitive     NITROFURANTOIN <=16 SENSITIVE Sensitive     OXACILLIN <=0.25 SENSITIVE Sensitive     TETRACYCLINE 2 SENSITIVE Sensitive     VANCOMYCIN <=0.5 SENSITIVE Sensitive     TRIMETH/SULFA <=10 SENSITIVE Sensitive     CLINDAMYCIN 0.5 SENSITIVE  Sensitive     RIFAMPIN <=0.5 SENSITIVE Sensitive     Inducible Clindamycin NEGATIVE Sensitive     * 20,000 COLONIES/mL STAPHYLOCOCCUS SPECIES (COAGULASE NEGATIVE)  MRSA PCR Screening     Status: None   Collection Time: 05/28/15 11:52 PM  Result Value Ref Range Status   MRSA by PCR NEGATIVE NEGATIVE Final    Comment:        The GeneXpert MRSA Assay (FDA approved for NASAL specimens only), is one component of a comprehensive MRSA colonization surveillance program. It is not intended to diagnose MRSA infection nor to guide or monitor treatment for MRSA infections.   Culture, Urine     Status: None   Collection Time: 05/31/15 10:54 PM  Result Value Ref Range Status   Specimen Description URINE, CATHETERIZED  Final   Special Requests Normal  Final  Culture   Final    NO GROWTH 2 DAYS Performed at [facility]    Report Status 06/03/2015 FINAL  Final  Culture, respiratory (NON-Expectorated)     Status: None   Collection Time: 05/31/15 10:54 PM  Result Value Ref Range Status   Specimen Description TRACHEAL ASPIRATE  Final   Special Requests NONE  Final   Gram Stain   Final    ABUNDANT WBC PRESENT,BOTH PMN AND MONONUCLEAR RARE SQUAMOUS EPITHELIAL CELLS PRESENT NO ORGANISMS SEEN Performed at [facility]    Culture   Final    MODERATE CANDIDA ALBICANS Performed at [facility]    Report Status 06/04/2015 FINAL  Final  Culture, blood (routine x 2)     Status: None (Preliminary result)   Collection Time: 05/31/15 11:10 PM  Result Value Ref Range Status   Specimen Description BLOOD RIGHT ANTECUBITAL  Final   Special Requests BOTTLES DRAWN AEROBIC AND ANAEROBIC 6CC EACH  Final   Culture NO GROWTH 4 DAYS  Final   Report Status PENDING  Incomplete  Culture, blood (routine x 2)     Status: None (Preliminary result)   Collection Time: 05/31/15 11:17 PM  Result Value Ref Range Status   Specimen Description BLOOD LEFT ARM  Final   Special Requests BOTTLES  DRAWN AEROBIC AND ANAEROBIC [place]  Final   Culture NO GROWTH 4 DAYS  Final   Report Status PENDING  Incomplete     Hospital Course: This is a 81 year old who came to the hospital because of failure to thrive and altered mental status and was found to have markedly elevated sodium as well as acute on chronic renal failure. He is being treated for that when he aspirated food and developed respiratory distress. He was intubated and placed on mechanical ventilation. He remained on the ventilator for about 72 hours. Discussion was undertaken with his family and they did not want any further aggressive treatment and felt that he was appropriate for hospice. He was able to be successfully extubated but had difficulty with swallowing. He is clearly appropriate for end-of-life care.  Discharge Exam: Blood pressure 148/68, pulse 92, temperature 99.5 F (37.5 C), temperature source Oral, resp. rate 20, height 5\' 2"  (1.575 m), weight 47.8 kg (105 lb 6.1 oz), SpO2 100 %. He is poorly responsive. He has significant chest congestion.  Disposition: He is going to be discharged but at this time his destination is unknown. We are awaiting information from hospice. He wil,l be GIP to start.      Signed: Kyarra Vancamp L   06/05/2015, 9:15 AM

## 2015-06-05 NOTE — Progress Notes (Signed)
Daily Progress Note   Patient Name: Jerry [name]       Date: 06/05/2015 DOB: July 11, 1916  Age: 81 y.o. MRN#: [id number] Attending Physician: Sinda Du, MD Primary Care Physician: Lanette Hampshire, MD Admit Date: 05/28/2015  Reason for Consultation/Follow-up: Non pain symptom management, Psychosocial/spiritual support and Terminal care  Subjective: Jerry [name] is lying quietly in bed today.  He is surrounded by his siblings.  He is noted to have some increased work of breathing and is holding tightly to both bed rails. He opens his eyes, but closes them immediately.  We talk about his pain control and intake.  His sister states her daughter was in hospice 10 months ago.  Family is very accepting of Jerry [name] decline.  I will schedule his morphine along with PRN.  Nursing states he has answered affirmatively to pain, although he does not at this time.    Interval Events: Jerry [name] has been accepted with [facility], but there are no beds and therefor he is now GIP status.   Length of Stay: 8 days  Current Medications: Scheduled Meds:  . [START ON 06/08/2015] scopolamine  1 patch Transdermal Q72H    Continuous Infusions:    PRN Meds: [DISCONTINUED] acetaminophen **OR** acetaminophen, albuterol, morphine CONCENTRATE, RESOURCE THICKENUP CLEAR  Palliative Performance Scale: 20%     Vital Signs: BP 148/68 mmHg  Pulse 92  Temp(Src) 99.5 F (37.5 C) (Oral)  Resp 20  Ht 5\' 2"  (1.575 m)  Wt 47.8 kg (105 lb 6.1 oz)  BMI 19.27 kg/m2  SpO2 100% SpO2: SpO2: 100 % O2 Device: O2 Device: Nasal Cannula O2 Flow Rate: O2 Flow Rate (L/min): 3 L/min  Intake/output summary:  Intake/Output Summary (Last 24 hours) at 06/05/15 1555 Last data filed at 06/05/15 0641  Gross per 24 hour  Intake      0 ml  Output   1202 ml  Net  -1202 ml   LBM:   Baseline Weight: Weight: 43.4 kg (95 lb 10.9 oz) Most recent weight: Weight: 47.8 kg (105 lb 6.1 oz)  Physical  Exam: Constitutional: Elderly frail, lying in bed, no eye contact, but will briefly open eyes.  Head: Temporal wasting.  Cardio: RRR Resp: Even, mild/moderate work of breathing.  Musc/skel: marked muscle wasting, deconditioning, cold extremities.               Additional Data Reviewed: Recent Labs     06/03/15  0432  06/04/15  0422  NA  147*  145  BUN  35*  35*  CREATININE  1.71*  1.70*     Problem List:  Patient Active Problem List   Diagnosis Date Noted  . Failure to thrive (0-17) 06/05/2015  . Dementia 06/04/2015  . Acute respiratory failure 06/03/2015  . Aspiration into airway   . FTT (failure to thrive) in adult   . Palliative care encounter   . DNR (do not resuscitate) discussion   . ARF (acute renal failure) 05/28/2015  . Protein-calorie malnutrition, severe 01/19/2015  . Altered mental status 01/17/2015  . Loss of appetite 01/13/2015  . Dehydration 01/13/2015  . CN (constipation) 01/13/2015  . Abdominal pain 01/13/2015  . Hypertension 10/21/2012  . Hypernatremia 10/21/2012  . Hip fracture, left 10/17/2012  . UTI (lower urinary tract infection) 10/17/2012  . Hypokalemia 10/17/2012  . Leukocytosis 10/17/2012     Palliative Care Assessment & Plan    Code Status:  DNR  Goals of Care:  Hospice  Symptom Management:  Morphine  0.25mg  PO/SL Q 4 hours sched. And Morphine 0.25 mg PO/SL Q 2 hours PRN  Psycho-social/Spiritual:  Desire for further Chaplaincy support: Patient unable to answer at this time.   Prognosis: Hours - Days Discharge Planning: Hospice facility   Care plan was discussed with nursing staff, CM, SW, and will report with Dr. Luan Pulling during next rounds.   Thank you for allowing the Palliative Medicine Team to assist in the care of this patient.   Time In: 1500 Time Out: 1530 Total Time 30 minutes Prolonged Time Billed  no     Greater than 50%  of this time was spent counseling and coordinating care related to the above  assessment and plan.   Drue Novel, NP  06/05/2015, 3:55 PM  Please contact Palliative Medicine Team phone at [phone] for questions and concerns.

## 2015-06-06 NOTE — Clinical Social Work Note (Signed)
No beds available at [facility] this morning. MD updated. Nursing station number given to [place] in case bed is available this weekend.  Benay Pike, [place]

## 2015-06-06 NOTE — Progress Notes (Signed)
Daily Progress Note   Patient Name: Jerry [name]       Date: 06/06/2015 DOB: [birth year]  Age: 81 y.o. MRN#: [id number] Attending Physician: Sinda Du, MD Primary Care Physician: Lanette Hampshire, MD Admit Date: 05/28/2015  Reason for Consultation/Follow-up: Non pain symptom management and Psychosocial/spiritual support  Subjective: [name] had an uneventful night.  He wakes when I enter the room and will make, but not keep eye contact.  He is non verbal today and is unable to give indication when asked if he has pain.  He does eat ice cream for me, swallowing several times with each bite and occasionally coughing.   Nursing staff report no needs for medication increases.  He continues to wait for a [facility] with [facility].    Length of Stay: 9 days  Current Medications: Scheduled Meds:  . morphine CONCENTRATE  2.6 mg Oral 6 times per day  . [START ON 06/08/2015] scopolamine  1 patch Transdermal Q72H    Continuous Infusions:    PRN Meds: [DISCONTINUED] acetaminophen **OR** acetaminophen, albuterol, morphine CONCENTRATE, RESOURCE THICKENUP CLEAR  Palliative Performance Scale: 20%     Vital Signs: BP 159/72 mmHg  Pulse 93  Temp(Src) 98.5 F (36.9 C) (Oral)  Resp 28  Ht 5\' 2"  (1.575 m)  Wt 47.8 kg (105 lb 6.1 oz)  BMI 19.27 kg/m2  SpO2 100% SpO2: SpO2: 100 % O2 Device: O2 Device: Nasal Cannula O2 Flow Rate: O2 Flow Rate (L/min): 3 L/min  Intake/output summary:  Intake/Output Summary (Last 24 hours) at 06/06/15 1330 Last data filed at 06/06/15 0641  Gross per 24 hour  Intake      0 ml  Output   1000 ml  Net  -1000 ml   LBM:   Baseline Weight: Weight: 43.4 kg (95 lb 10.9 oz) Most recent weight: Weight: 47.8 kg (105 lb 6.1 oz)  Physical Exam: Constitutional: Elderly frail, lying in bed, will make but not keep eye contact.   Head: Temporal wasting.  Cardio: RRR Resp: Even, mild/moderate work of breathing. Cough noted with feeding.    Musc/skel: marked muscle wasting, deconditioning, cold extremities.               Additional Data Reviewed: Recent Labs     06/04/15  0422  NA  145  BUN  35*  CREATININE  1.70*     Problem List:  Patient Active Problem List   Diagnosis Date Noted  . Failure to thrive (0-17) 06/05/2015  . Dementia 06/04/2015  . Acute respiratory failure 06/03/2015  . Aspiration into airway   . FTT (failure to thrive) in adult   . Palliative care encounter   . DNR (do not resuscitate) discussion   . ARF (acute renal failure) 05/28/2015  . Protein-calorie malnutrition, severe 01/19/2015  . Altered mental status 01/17/2015  . Loss of appetite 01/13/2015  . Dehydration 01/13/2015  . CN (constipation) 01/13/2015  . Abdominal pain 01/13/2015  . Hypertension 10/21/2012  . Hypernatremia 10/21/2012  . Hip fracture, left 10/17/2012  . UTI (lower urinary tract infection) 10/17/2012  . Hypokalemia 10/17/2012  . Leukocytosis 10/17/2012     Palliative Care Assessment & Plan    Code Status:  DNR  Goals of Care:  In patient Hospice  Symptom Management:  Morphine 2.5 mg PO/SL Q 4 hours and Morphine 2.5 mg PO.SL Q 2 hours PRN for dyspnea.   Scopolamine patch for secretions.   Prognosis: < 2 weeks Discharge Planning: Hospice facility  Care plan was discussed with Nursing staff, CM, and SW.  Thank you for allowing the Palliative Medicine Team to assist in the care of this patient.   Time In: 0935 Time Out: 1000 Total Time 25 minutes Prolonged Time Billed  no     Greater than 50%  of this time was spent counseling and coordinating care related to the above assessment and plan.   Drue Novel, NP  06/06/2015, 1:30 PM  Please contact Palliative Medicine Team phone at [phone] for questions and concerns.

## 2015-06-06 NOTE — Progress Notes (Signed)
Subjective: He is overall about the same. He is perhaps a little more alert  Objective: Vital signs in last 24 hours: Temp:  [98.5 F (36.9 C)] 98.5 F (36.9 C) (08/05 0620) Pulse Rate:  [93] 93 (08/05 0620) Resp:  [20-30] 20 (08/05 0620) BP: (159-165)/(72) 159/72 mmHg (08/05 0620) SpO2:  [100 %] 100 % (08/05 0620) Weight change:  Last BM Date: 06/05/15  Intake/Output from previous day: 08/04 0701 - 08/05 0700 In: -  Out: 1000 [Urine:1000]  PHYSICAL EXAM He does acknowledge my presence and says high. He is very thin. He doesn't seem quite as congested today. He still has rhonchi bilaterally. His heart is regular. His abdomen is soft.  Lab Results:  Results for orders placed or performed during the hospital encounter of 05/28/15 (from the past 48 hour(s))  Glucose, capillary     Status: None   Collection Time: 06/04/15 11:23 AM  Result Value Ref Range   Glucose-Capillary 92 65 - 99 mg/dL   Comment 1 Notify RN   Glucose, capillary     Status: Abnormal   Collection Time: 06/04/15  4:19 PM  Result Value Ref Range   Glucose-Capillary 109 (H) 65 - 99 mg/dL   Comment 1 Notify RN   Glucose, capillary     Status: None   Collection Time: 06/04/15  8:55 PM  Result Value Ref Range   Glucose-Capillary 89 65 - 99 mg/dL   Comment 1 Notify RN    Comment 2 Document in Chart   Glucose, capillary     Status: None   Collection Time: 06/05/15 12:41 AM  Result Value Ref Range   Glucose-Capillary 79 65 - 99 mg/dL   Comment 1 Notify RN    Comment 2 Document in Chart   Glucose, capillary     Status: None   Collection Time: 06/05/15  5:03 AM  Result Value Ref Range   Glucose-Capillary 77 65 - 99 mg/dL  Glucose, capillary     Status: None   Collection Time: 06/05/15  7:33 AM  Result Value Ref Range   Glucose-Capillary 69 65 - 99 mg/dL   Comment 1 Notify RN     ABGS No results for input(s): PHART, PO2ART, TCO2, HCO3 in the last 72 hours.  Invalid input(s): PCO2 CULTURES Recent  Results (from the past 240 hour(s))  Urine culture     Status: None   Collection Time: 05/28/15  4:40 PM  Result Value Ref Range Status   Specimen Description URINE, CATHETERIZED  Final   Special Requests NONE  Final   Culture   Final    20,000 COLONIES/mL STAPHYLOCOCCUS SPECIES (COAGULASE NEGATIVE) Performed at [facility]    Report Status 05/31/2015 FINAL  Final   Organism ID, Bacteria STAPHYLOCOCCUS SPECIES (COAGULASE NEGATIVE)  Final      Susceptibility   Staphylococcus species (coagulase negative) - MIC*    CIPROFLOXACIN <=0.5 SENSITIVE Sensitive     GENTAMICIN <=0.5 SENSITIVE Sensitive     NITROFURANTOIN <=16 SENSITIVE Sensitive     OXACILLIN <=0.25 SENSITIVE Sensitive     TETRACYCLINE 2 SENSITIVE Sensitive     VANCOMYCIN <=0.5 SENSITIVE Sensitive     TRIMETH/SULFA <=10 SENSITIVE Sensitive     CLINDAMYCIN 0.5 SENSITIVE Sensitive     RIFAMPIN <=0.5 SENSITIVE Sensitive     Inducible Clindamycin NEGATIVE Sensitive     * 20,000 COLONIES/mL STAPHYLOCOCCUS SPECIES (COAGULASE NEGATIVE)  MRSA PCR Screening     Status: None   Collection Time: 05/28/15 11:52 PM  Result Value Ref Range Status   MRSA by PCR NEGATIVE NEGATIVE Final    Comment:        The GeneXpert MRSA Assay (FDA approved for NASAL specimens only), is one component of a comprehensive MRSA colonization surveillance program. It is not intended to diagnose MRSA infection nor to guide or monitor treatment for MRSA infections.   Culture, Urine     Status: None   Collection Time: 05/31/15 10:54 PM  Result Value Ref Range Status   Specimen Description URINE, CATHETERIZED  Final   Special Requests Normal  Final   Culture   Final    NO GROWTH 2 DAYS Performed at [facility]    Report Status 06/03/2015 FINAL  Final  Culture, respiratory (NON-Expectorated)     Status: None   Collection Time: 05/31/15 10:54 PM  Result Value Ref Range Status   Specimen Description TRACHEAL ASPIRATE  Final   Special  Requests NONE  Final   Gram Stain   Final    ABUNDANT WBC PRESENT,BOTH PMN AND MONONUCLEAR RARE SQUAMOUS EPITHELIAL CELLS PRESENT NO ORGANISMS SEEN Performed at [facility]    Culture   Final    MODERATE CANDIDA ALBICANS Performed at [facility]    Report Status 06/04/2015 FINAL  Final  Culture, blood (routine x 2)     Status: None   Collection Time: 05/31/15 11:10 PM  Result Value Ref Range Status   Specimen Description BLOOD RIGHT ANTECUBITAL  Final   Special Requests BOTTLES DRAWN AEROBIC AND ANAEROBIC 6CC EACH  Final   Culture NO GROWTH 5 DAYS  Final   Report Status 06/05/2015 FINAL  Final  Culture, blood (routine x 2)     Status: None   Collection Time: 05/31/15 11:17 PM  Result Value Ref Range Status   Specimen Description BLOOD LEFT ARM  Final   Special Requests BOTTLES DRAWN AEROBIC AND ANAEROBIC [place]  Final   Culture NO GROWTH 5 DAYS  Final   Report Status 06/05/2015 FINAL  Final   Studies/Results: No results found.  Medications:  Prior to Admission:  Prescriptions prior to admission  Medication Sig Dispense Refill Last Dose  . polyethylene glycol (MIRALAX / GLYCOLAX) packet Take 17 g by mouth daily. 14 each 0 05/28/2015 at Unknown time   Scheduled: . morphine CONCENTRATE  2.6 mg Oral 6 times per day  . [START ON 06/08/2015] scopolamine  1 patch Transdermal Q72H   Continuous:  PRN:[DISCONTINUED] acetaminophen **OR** acetaminophen, albuterol, morphine CONCENTRATE, RESOURCE THICKENUP CLEAR  Assesment: He is receiving palliative care and end-of-life care. He has multiple medical problems including severe protein calorie nutrition, failure to thrive and aspiration of food. He required intubation and mechanical ventilation but is often now has DO NOT RESUSCITATE status which is appropriate. Active Problems:   Leukocytosis   Hypertension   Hypernatremia   Protein-calorie malnutrition, severe   ARF (acute renal failure)   Aspiration into airway    FTT (failure to thrive) in adult   Palliative care encounter   DNR (do not resuscitate) discussion   Acute respiratory failure   Dementia   Failure to thrive (0-17)    Plan: Continue palliative care    LOS: 9 days   Denaja Verhoeven L 06/06/2015, 8:17 AM

## 2015-06-06 NOTE — Progress Notes (Signed)
Hospice chaplain in to visit. Pt's sister, Berenice Primas "[facility], was present in the room. Pt's eyes were closed and he had a relaxed facial expression. While we were talking, Pt would occasionally open his eyes, but did not attempt to speak. Ms Hampton Abbot said Pt has good family support. He has attended [facility] in [place], where his brother is Theme park manager. Ms Hampton Abbot denies spiritual concerns for Pt and said "he's ready." She is open to chaplaincy visits. Her prayer request is for pt "to go home and see the rest of them [i.e., other family members who are deceased]." Provided support an closed visit with prayer. Delivered stress packet along with my business card. Ms Park expressed appreciation for visit.   Plan to visit again next week to provide spiritual support.

## 2015-06-07 NOTE — Progress Notes (Signed)
Patient was discharged to [facility] this evening.  Patient was transported via EMS in stable condition.  Kathie Rhodes, RN at hospice was updated on patient's status and most recent VS.  I called Patient's son and POA to let him know of patient's discharge.

## 2015-06-07 NOTE — Progress Notes (Signed)
Report given to Kathie Rhodes at [facility].  Answered all questions and concerns regarding patient.  Patient to be transferred via EMS to [facility] this afternoon after 2:00pm per Thayer Headings.

## 2015-06-07 NOTE — Progress Notes (Signed)
Subjective: He is essentially unchanged. He may be a little less responsive this morning.  Objective: Vital signs in last 24 hours: Temp:  [98.1 F (36.7 C)-98.8 F (37.1 C)] 98.8 F (37.1 C) (08/06 0514) Pulse Rate:  [96-108] 108 (08/06 0514) Resp:  [22-24] 22 (08/06 0514) BP: (143-147)/(64-75) 143/75 mmHg (08/06 0514) SpO2:  [92 %-97 %] 93 % (08/06 0514) Weight change:  Last BM Date: 06/06/15  Intake/Output from previous day: 08/05 0701 - 08/06 0700 In: -  Out: 750 [Urine:750]  PHYSICAL EXAM General appearance: Poorly responsive. Very thin Resp: rhonchi bilaterally Cardio: regular rate and rhythm, S1, S2 normal, no murmur, click, rub or gallop GI: soft, non-tender; bowel sounds normal; no masses,  no organomegaly Extremities: extremities normal, atraumatic, no cyanosis or edema  Lab Results:  No results found for this or any previous visit (from the past 48 hour(s)).  ABGS No results for input(s): PHART, PO2ART, TCO2, HCO3 in the last 72 hours.  Invalid input(s): PCO2 CULTURES Recent Results (from the past 240 hour(s))  Urine culture     Status: None   Collection Time: 05/28/15  4:40 PM  Result Value Ref Range Status   Specimen Description URINE, CATHETERIZED  Final   Special Requests NONE  Final   Culture   Final    20,000 COLONIES/mL STAPHYLOCOCCUS SPECIES (COAGULASE NEGATIVE) Performed at [facility]    Report Status 05/31/2015 FINAL  Final   Organism ID, Bacteria STAPHYLOCOCCUS SPECIES (COAGULASE NEGATIVE)  Final      Susceptibility   Staphylococcus species (coagulase negative) - MIC*    CIPROFLOXACIN <=0.5 SENSITIVE Sensitive     GENTAMICIN <=0.5 SENSITIVE Sensitive     NITROFURANTOIN <=16 SENSITIVE Sensitive     OXACILLIN <=0.25 SENSITIVE Sensitive     TETRACYCLINE 2 SENSITIVE Sensitive     VANCOMYCIN <=0.5 SENSITIVE Sensitive     TRIMETH/SULFA <=10 SENSITIVE Sensitive     CLINDAMYCIN 0.5 SENSITIVE Sensitive     RIFAMPIN <=0.5 SENSITIVE  Sensitive     Inducible Clindamycin NEGATIVE Sensitive     * 20,000 COLONIES/mL STAPHYLOCOCCUS SPECIES (COAGULASE NEGATIVE)  MRSA PCR Screening     Status: None   Collection Time: 05/28/15 11:52 PM  Result Value Ref Range Status   MRSA by PCR NEGATIVE NEGATIVE Final    Comment:        The GeneXpert MRSA Assay (FDA approved for NASAL specimens only), is one component of a comprehensive MRSA colonization surveillance program. It is not intended to diagnose MRSA infection nor to guide or monitor treatment for MRSA infections.   Culture, Urine     Status: None   Collection Time: 05/31/15 10:54 PM  Result Value Ref Range Status   Specimen Description URINE, CATHETERIZED  Final   Special Requests Normal  Final   Culture   Final    NO GROWTH 2 DAYS Performed at [facility]    Report Status 06/03/2015 FINAL  Final  Culture, respiratory (NON-Expectorated)     Status: None   Collection Time: 05/31/15 10:54 PM  Result Value Ref Range Status   Specimen Description TRACHEAL ASPIRATE  Final   Special Requests NONE  Final   Gram Stain   Final    ABUNDANT WBC PRESENT,BOTH PMN AND MONONUCLEAR RARE SQUAMOUS EPITHELIAL CELLS PRESENT NO ORGANISMS SEEN Performed at [facility]    Culture   Final    MODERATE CANDIDA ALBICANS Performed at [facility]    Report Status 06/04/2015 FINAL  Final  Culture, blood (routine x 2)     Status: None   Collection Time: 05/31/15 11:10 PM  Result Value Ref Range Status   Specimen Description BLOOD RIGHT ANTECUBITAL  Final   Special Requests BOTTLES DRAWN AEROBIC AND ANAEROBIC [place]  Final   Culture NO GROWTH 5 DAYS  Final   Report Status 06/05/2015 FINAL  Final  Culture, blood (routine x 2)     Status: None   Collection Time: 05/31/15 11:17 PM  Result Value Ref Range Status   Specimen Description BLOOD LEFT ARM  Final   Special Requests BOTTLES DRAWN AEROBIC AND ANAEROBIC [place]  Final   Culture NO GROWTH 5 DAYS  Final    Report Status 06/05/2015 FINAL  Final   Studies/Results: No results found.  Medications:  Prior to Admission:  Prescriptions prior to admission  Medication Sig Dispense Refill Last Dose  . polyethylene glycol (MIRALAX / GLYCOLAX) packet Take 17 g by mouth daily. 14 each 0 05/28/2015 at Unknown time   Scheduled: . morphine CONCENTRATE  2.6 mg Oral 6 times per day  . [START ON 06/08/2015] scopolamine  1 patch Transdermal Q72H   Continuous:  PRN:[DISCONTINUED] acetaminophen **OR** acetaminophen, albuterol, morphine CONCENTRATE, RESOURCE THICKENUP CLEAR  Assesment:  he was admitted with acute renal failure and failure to thrive. He has severe protein calorie malnutrition and significant dementia at baseline. He is appropriate for hospice care and will be transferred to the hospice inpatient facility when a bed is available  Active Problems:   Leukocytosis   Hypertension   Hypernatremia   Protein-calorie malnutrition, severe   ARF (acute renal failure)   Aspiration into airway   FTT (failure to thrive) in adult   Palliative care encounter   DNR (do not resuscitate) discussion   Acute respiratory failure   Dementia   Failure to thrive (0-17)    Plan: Comfort care and end-of-life care    LOS: 10 days   Janeice Stegall L 06/07/2015, 9:26 AM

## 2015-06-08 NOTE — Discharge Summary (Signed)
Physician Discharge Summary  Patient ID: Jerry [name] MRN: [id number] DOB/AGE: 01/03/1916 81 y.o. Primary Care Physician:MCINNIS,ANGUS G, MD Admit date: 05/28/2015 Discharge date: 06/08/2015    Discharge Diagnoses:   Active Problems:   Leukocytosis   Hypertension   Hypernatremia   Protein-calorie malnutrition, severe   ARF (acute renal failure)   Aspiration into airway   FTT (failure to thrive) in adult   Palliative care encounter   DNR (do not resuscitate) discussion   Acute respiratory failure   Dementia   Failure to thrive (0-17)     Medication List    TAKE these medications        acetaminophen 650 MG suppository  Commonly known as:  TYLENOL  Place 1 suppository (650 mg total) rectally every 6 (six) hours as needed for mild pain (or Fever >/= 101).     albuterol (2.5 MG/3ML) 0.083% nebulizer solution  Commonly known as:  PROVENTIL  Take 3 mLs (2.5 mg total) by nebulization every 6 (six) hours as needed for wheezing or shortness of breath (for comfort and work of breathing.).     morphine 2 MG/ML injection  Inject 0.25 mLs (0.5 mg total) into the vein every 4 (four) hours as needed (please give for s/s of dyspnea and for RR 20 or greater.).     ondansetron 4 MG/2ML Soln injection  Commonly known as:  ZOFRAN  Inject 2 mLs (4 mg total) into the vein every 6 (six) hours as needed for nausea.     polyethylene glycol packet  Commonly known as:  MIRALAX / GLYCOLAX  Take 17 g by mouth daily.        Discharged Condition: Unchanged    Consults: Palliative care  Significant Diagnostic Studies: Dg Abd 1 View  05/29/2015   CLINICAL DATA:  Generalized abdominal pain.  EXAM: ABDOMEN - 1 VIEW  COMPARISON:  01/17/2015  FINDINGS: Gallstones are visible in the right upper quadrant. There are calcifications superimposed on the left kidney which may represent nephrolithiasis. No ureteral calculi are evident. Abdominal gas pattern is normal. Severe lumbar degenerative  disc changes incidentally noted.  IMPRESSION: Cholelithiasis. Possible left nephrolithiasis. Normal bowel gas pattern.   Electronically Signed   By: Andreas Newport M.D.   On: 05/29/2015 19:12   Portable Chest Xray  05/31/2015   CLINICAL DATA:  Shortness of breath, aspiration  EXAM: PORTABLE CHEST - 1 VIEW  COMPARISON:  05/30/2015  FINDINGS: Endotracheal tube terminates 4 cm above the carina.  Mild bibasilar opacities, likely atelectasis. No focal consolidation. Mild blunting of the left costophrenic angle, possibly reflecting a small pleural effusion. No pneumothorax.  The heart is normal in size.  Enteric tube courses into the stomach.  IMPRESSION: Endotracheal tube terminates 4 cm above the carina.  Mild bibasilar opacities, likely atelectasis.   Electronically Signed   By: Julian Hy M.D.   On: 05/31/2015 08:22   Dg Chest Port 1 View  05/30/2015   CLINICAL DATA:  Aspiration.  EXAM: PORTABLE CHEST - 1 VIEW  COMPARISON:  May 28, 2015.  FINDINGS: The heart size and mediastinal contours are within normal limits. No pneumothorax or pleural effusion is noted. Minimal bibasilar subsegmental atelectasis is noted. Mild dextroscoliosis of thoracic spine.  IMPRESSION: Minimal bibasilar subsegmental atelectasis.   Electronically Signed   By: Marijo Conception, M.D.   On: 05/30/2015 12:20   Dg Chest Port 1 View  05/28/2015   CLINICAL DATA:  Week, not eating  EXAM: PORTABLE CHEST - 1 VIEW  COMPARISON:  Chest x-ray of 01/17/2015  FINDINGS: The lungs are clear and somewhat hyperaerated. No infiltrate or effusion is seen. Mediastinal and hilar contours are unremarkable. The heart is within normal limits in size. No bony abnormality is seen.  IMPRESSION: Hyper aeration.  No active lung disease.   Electronically Signed   By: Ivar Drape M.D.   On: 05/28/2015 16:50   Dg Chest Port 1v Same Day  05/30/2015   CLINICAL DATA:  ET tube placement  EXAM: PORTABLE CHEST - 1 VIEW SAME DAY  COMPARISON:  05/30/2015,  05/28/2015  FINDINGS: The lungs are hyperinflated likely secondary to COPD. There is an endotracheal tube with the tip 4 cm above the carina. There is a nasogastric tube with the tip projecting over the stomach.  There is hazy right basilar airspace disease which may reflect atelectasis versus developing pneumonia. There is no pleural effusion or pneumothorax. The heart and mediastinal contours are unremarkable.  The osseous structures are unremarkable.  IMPRESSION: 1. Endotracheal tube with the tip 4 cm above the carina.   Electronically Signed   By: Kathreen Devoid   On: 05/30/2015 13:11    Lab Results: Basic Metabolic Panel: No results for input(s): NA, K, CL, CO2, GLUCOSE, BUN, CREATININE, CALCIUM, MG, PHOS in the last 72 hours. Liver Function Tests: No results for input(s): AST, ALT, ALKPHOS, BILITOT, PROT, ALBUMIN in the last 72 hours.   CBC: No results for input(s): WBC, NEUTROABS, HGB, HCT, MCV, PLT in the last 72 hours.  Recent Results (from the past 240 hour(s))  Culture, Urine     Status: None   Collection Time: 05/31/15 10:54 PM  Result Value Ref Range Status   Specimen Description URINE, CATHETERIZED  Final   Special Requests Normal  Final   Culture   Final    NO GROWTH 2 DAYS Performed at [facility]    Report Status 06/03/2015 FINAL  Final  Culture, respiratory (NON-Expectorated)     Status: None   Collection Time: 05/31/15 10:54 PM  Result Value Ref Range Status   Specimen Description TRACHEAL ASPIRATE  Final   Special Requests NONE  Final   Gram Stain   Final    ABUNDANT WBC PRESENT,BOTH PMN AND MONONUCLEAR RARE SQUAMOUS EPITHELIAL CELLS PRESENT NO ORGANISMS SEEN Performed at [facility]    Culture   Final    MODERATE CANDIDA ALBICANS Performed at [facility]    Report Status 06/04/2015 FINAL  Final  Culture, blood (routine x 2)     Status: None   Collection Time: 05/31/15 11:10 PM  Result Value Ref Range Status   Specimen Description  BLOOD RIGHT ANTECUBITAL  Final   Special Requests BOTTLES DRAWN AEROBIC AND ANAEROBIC [facility] EACH  Final   Culture NO GROWTH 5 DAYS  Final   Report Status 06/05/2015 FINAL  Final  Culture, blood (routine x 2)     Status: None   Collection Time: 05/31/15 11:17 PM  Result Value Ref Range Status   Specimen Description BLOOD LEFT ARM  Final   Special Requests BOTTLES DRAWN AEROBIC AND ANAEROBIC [place]  Final   Culture NO GROWTH 5 DAYS  Final   Report Status 06/05/2015 FINAL  Final     Hospital Course: This is a 81 year old who was transferred from the acute care service to [facility] for palliative care/end-of-life care awaiting placement in a hospice inpatient facility he was comfortable throughout his stay with no problems noted.  Discharge Exam: Blood pressure  135/70, pulse 113, temperature 98.6 F (37 C), temperature source Axillary, resp. rate 28, height 5\' 2"  (1.575 m), weight 47.8 kg (105 lb 6.1 oz), SpO2 95 %. He will open his eyes. His chest shows rhonchi. His heart is regular  Disposition: To inpatient hospice facility. His wife expectancy is less than 6 weeks      Discharge Instructions    Discharge patient    Complete by:  As directed              Signed: Abdelaziz Westenberger L   06/08/2015, 7:36 AM

## 2015-06-16 ENCOUNTER — Inpatient Hospital Stay (HOSPITAL_COMMUNITY)
Admission: AD | Admit: 2015-06-16 | Disposition: A | Discharge status: Hospice | Source: Hospice | Admitting: Pulmonary Disease

## 2015-07-03 DEATH — deceased

## 2016-02-22 IMAGING — DX DG PELVIS 1-2V
2 series · 2 of 2 positions shown · non-contrast
Comparison: Acute abdominal series 01/13/2015

CLINICAL DATA: Altered mental status. Increased weakness. Loss of
appetite.

EXAM:
PELVIS - 1-2 VIEW

[pelvis ap (1 of 2)]
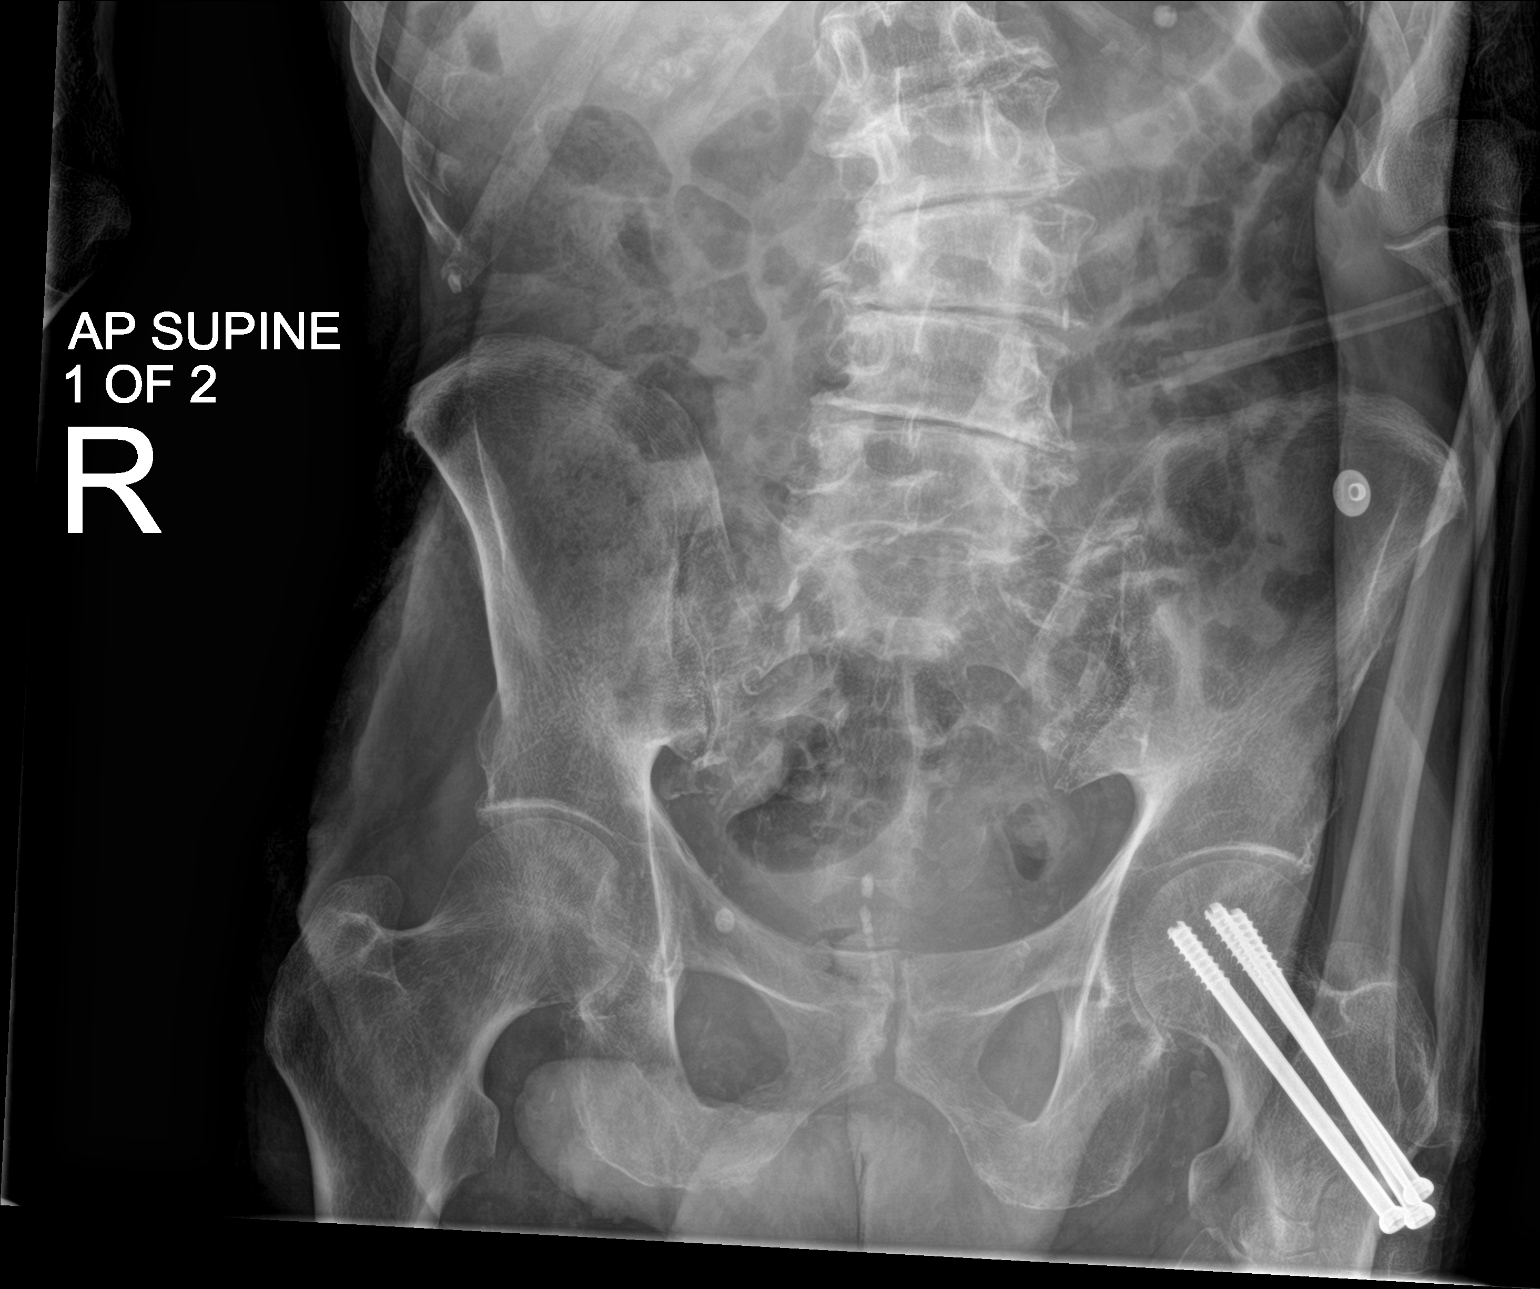

[pelvis ap (2 of 2)]
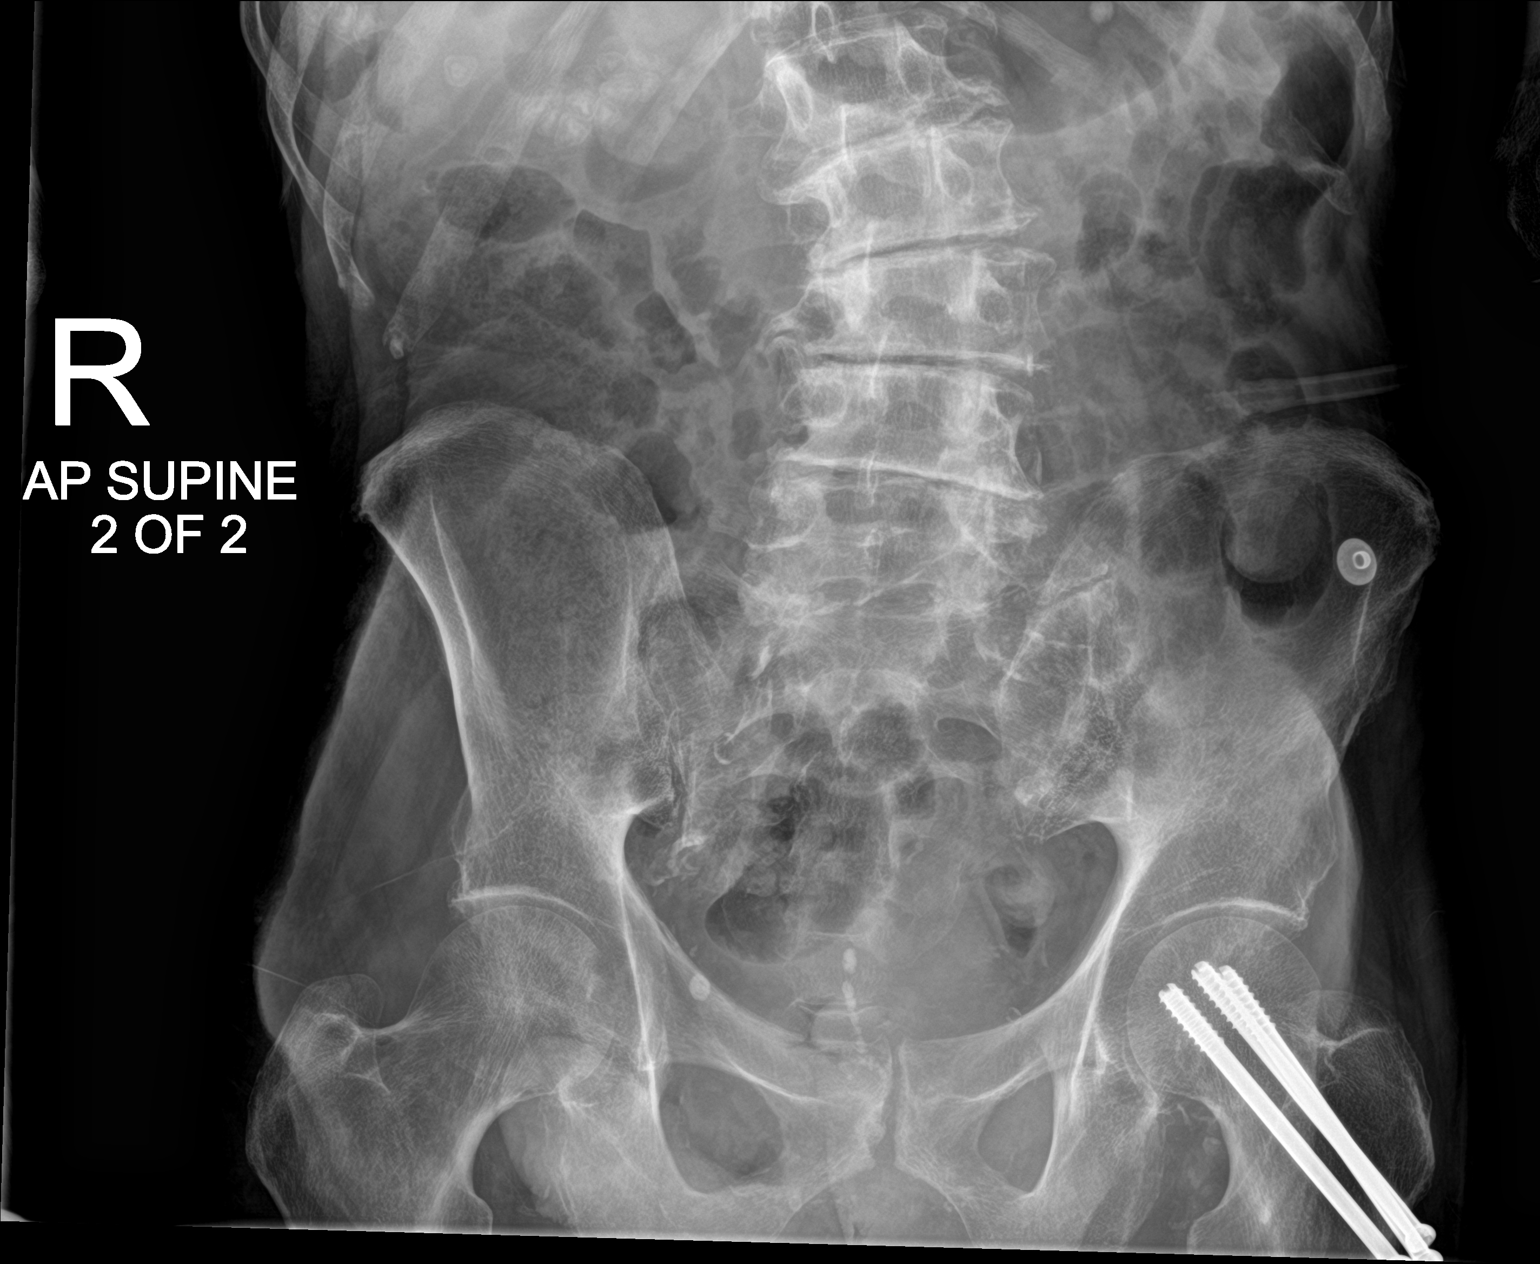

[2 of 2 positions shown; findings below may reference images not displayed]

FINDINGS: The bowel gas pattern is unremarkable. There is no evidence for
obstruction or free air. Postsurgical changes are noted at the left
femur.
IMPRESSION: Normal bowel gas pattern.  No evidence for obstruction or free air.

Colonic stool burden has decreased.

## 2016-02-22 IMAGING — CR DG CHEST 1V PORT
1 series · 1 of 1 positions shown · non-contrast
Comparison: None.

CLINICAL DATA: Altered mental status.

EXAM:
PORTABLE CHEST - 1 VIEW

[ap]
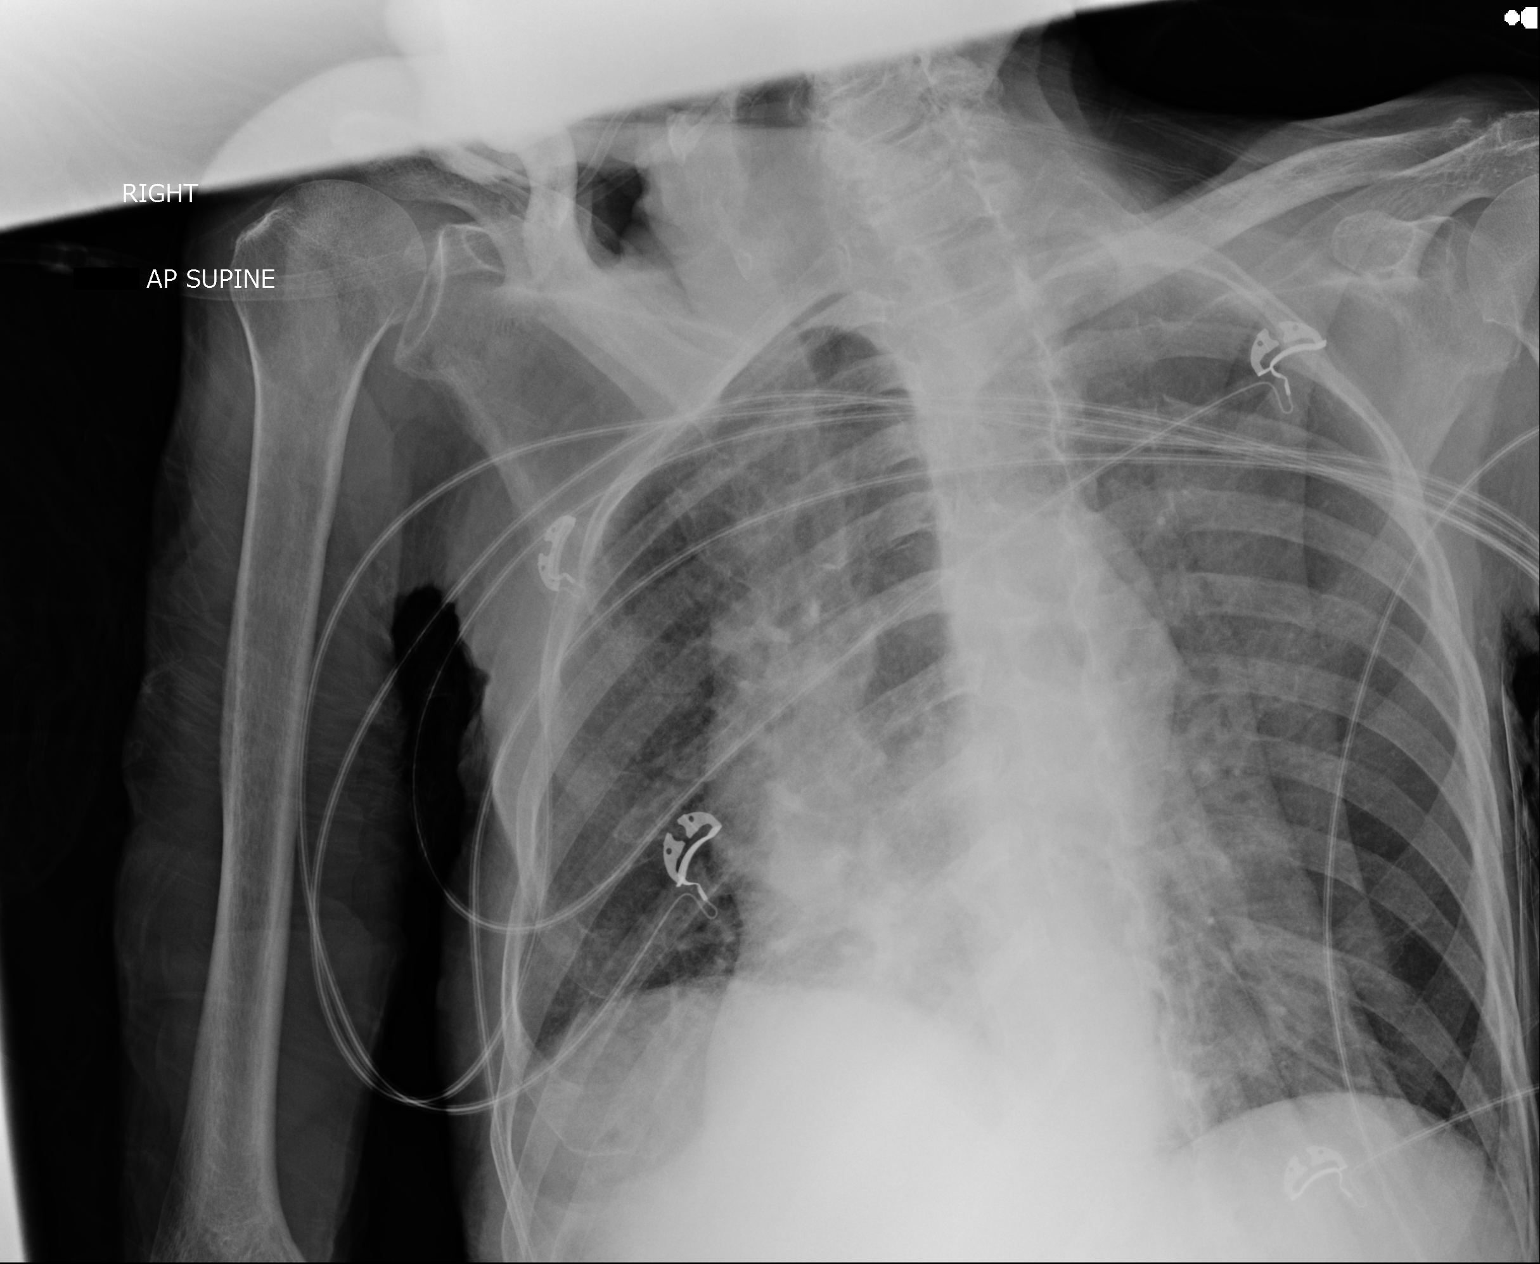

[1 of 1 positions shown; findings below may reference images not displayed]

FINDINGS: Patient in fetal position for imaging. Patient had to be held down
in position for imaging.

Patient rotated rightward. Normal cardiac silhouette. No effusion,
infiltrate, pneumothorax.
IMPRESSION: No acute cardiopulmonary process. Some limitation due to patient
positioning as above.

## 2016-07-03 IMAGING — CR DG ABDOMEN 1V
1 series · 1 of 1 positions shown · non-contrast
Comparison: 01/17/2015

CLINICAL DATA: Generalized abdominal pain.

EXAM:
ABDOMEN - 1 VIEW

[supine ap]
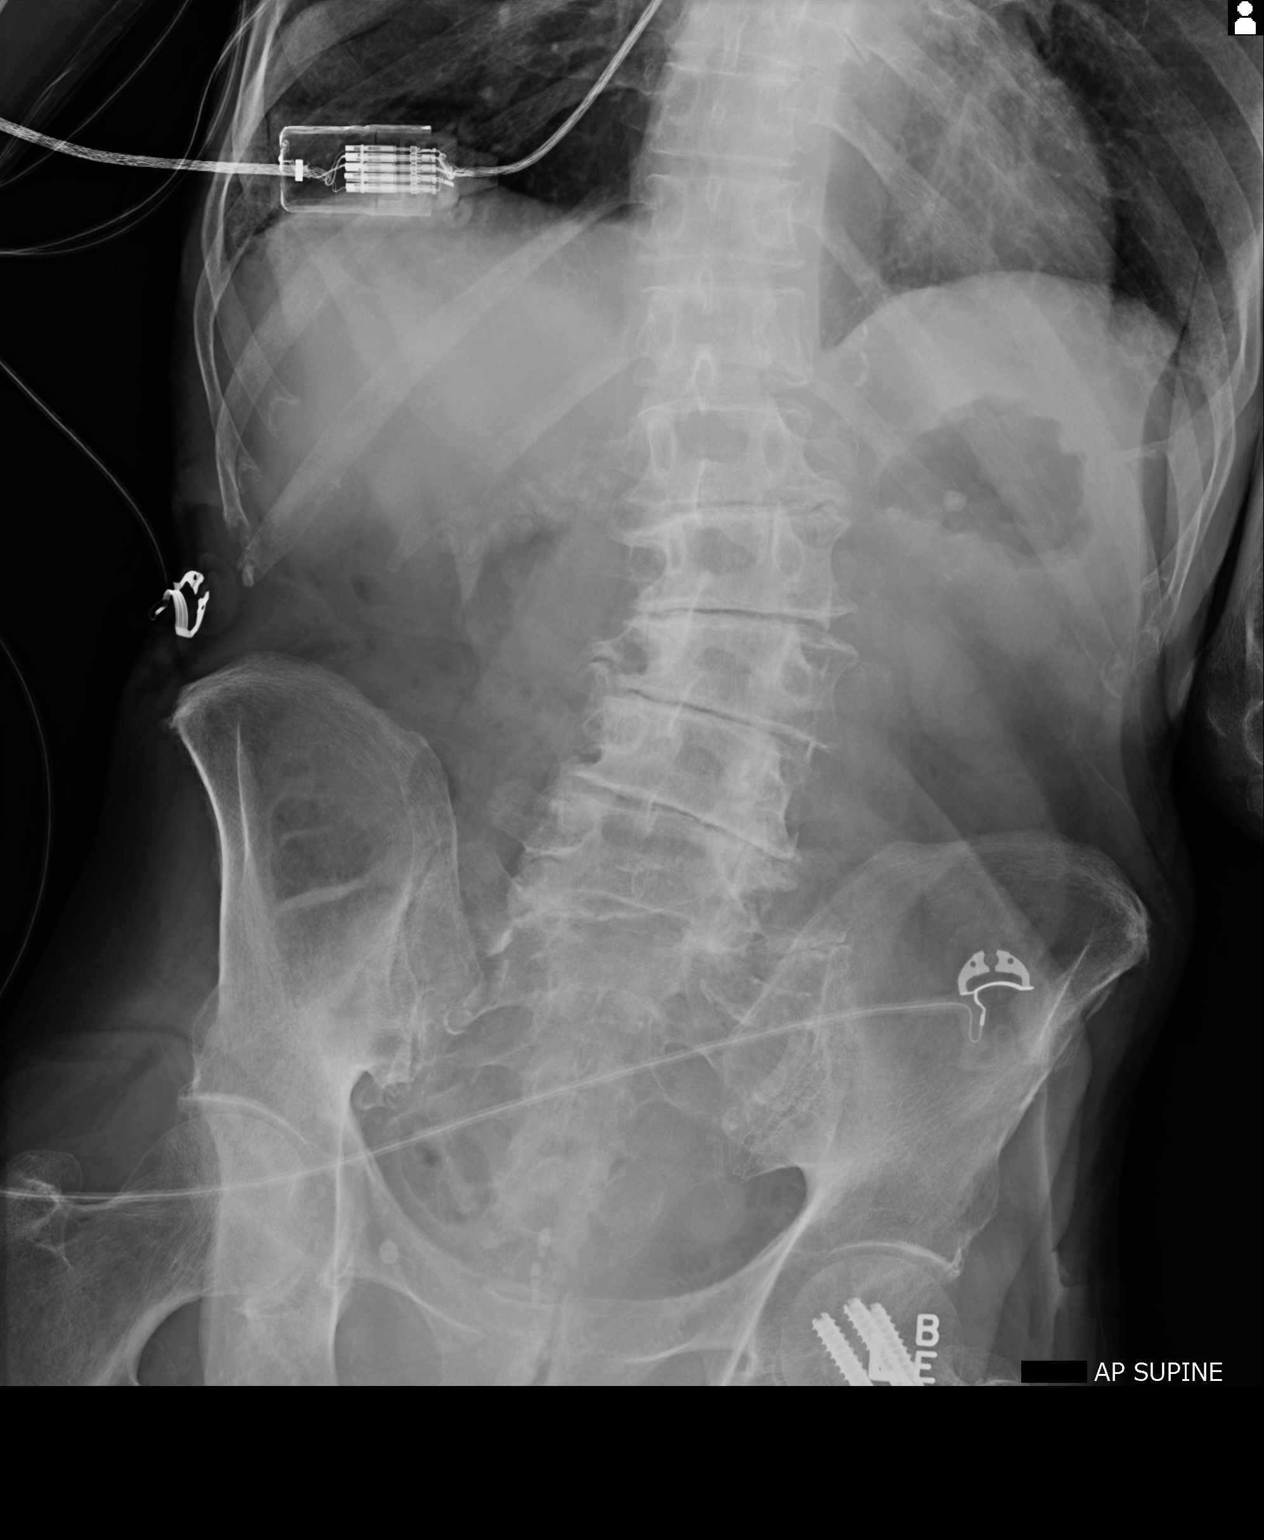

[1 of 1 positions shown; findings below may reference images not displayed]

FINDINGS: Gallstones are visible in the right upper quadrant. There are
calcifications superimposed on the left kidney which may represent
nephrolithiasis. No ureteral calculi are evident. Abdominal gas
pattern is normal. Severe lumbar degenerative disc changes
incidentally noted.
IMPRESSION: Cholelithiasis. Possible left nephrolithiasis. Normal bowel gas
pattern.

## 2016-07-04 IMAGING — CR DG CHEST 1V PORT SAME DAY
1 series · 1 of 1 positions shown · non-contrast
Comparison: 05/30/2015, 05/28/2015

CLINICAL DATA: ET tube placement

EXAM:
PORTABLE CHEST - 1 VIEW SAME DAY

[ap portable]
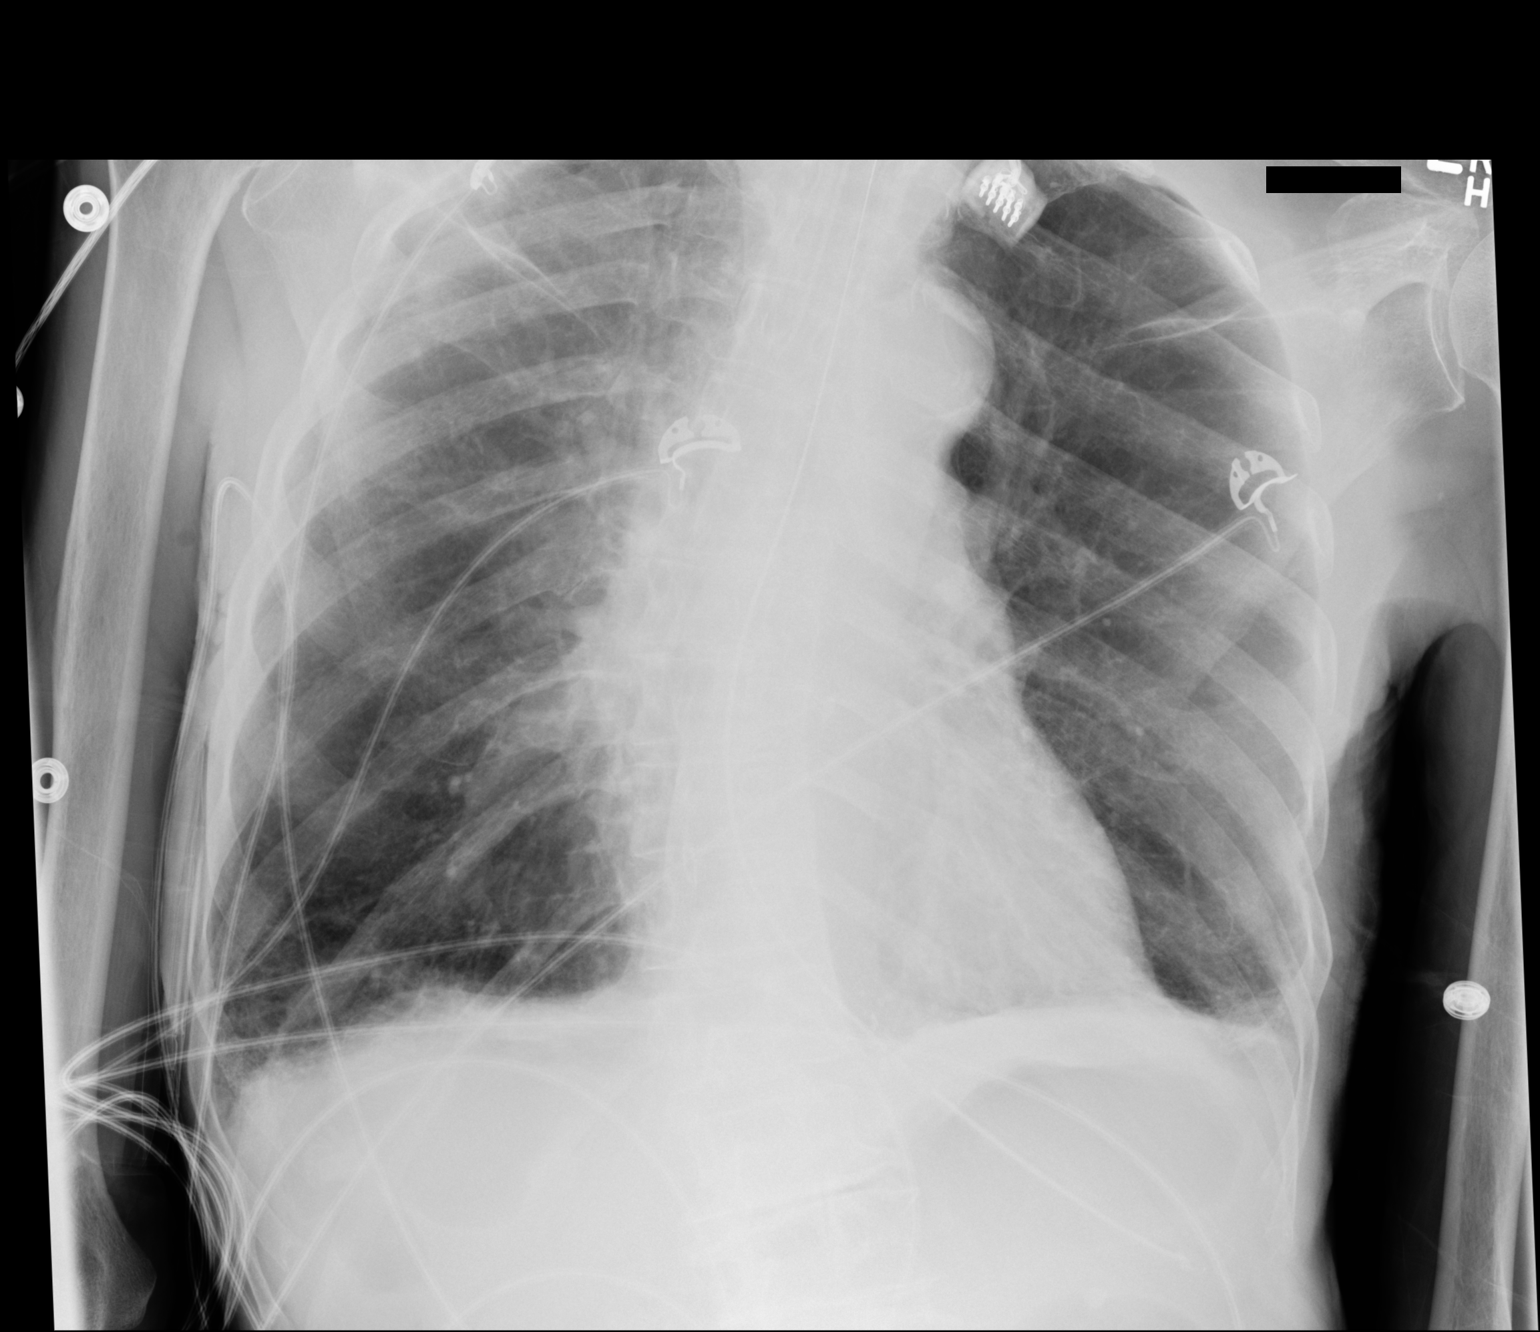

[1 of 1 positions shown; findings below may reference images not displayed]

FINDINGS: The lungs are hyperinflated likely secondary to COPD. There is an
endotracheal tube with the tip 4 cm above the carina. There is a
nasogastric tube with the tip projecting over the stomach.

There is hazy right basilar airspace disease which may reflect
atelectasis versus developing pneumonia. There is no pleural
effusion or pneumothorax. The heart and mediastinal contours are
unremarkable.

The osseous structures are unremarkable.
IMPRESSION: 1. Endotracheal tube with the tip 4 cm above the carina.
